# Patient Record
Sex: Male | Born: 1980 | Race: White | Hispanic: No | Smoking: Current every day smoker
Health system: Southern US, Community
[De-identification: ages and names within clinical notes are randomized; demographics above are authoritative.]

## PROBLEM LIST (undated history)

## (undated) DIAGNOSIS — F329 Major depressive disorder, single episode, unspecified: Secondary | ICD-10-CM

## (undated) DIAGNOSIS — Z8619 Personal history of other infectious and parasitic diseases: Secondary | ICD-10-CM

## (undated) DIAGNOSIS — C801 Malignant (primary) neoplasm, unspecified: Secondary | ICD-10-CM

## (undated) DIAGNOSIS — Z7189 Other specified counseling: Principal | ICD-10-CM

## (undated) DIAGNOSIS — K219 Gastro-esophageal reflux disease without esophagitis: Secondary | ICD-10-CM

## (undated) DIAGNOSIS — I82409 Acute embolism and thrombosis of unspecified deep veins of unspecified lower extremity: Secondary | ICD-10-CM

## (undated) DIAGNOSIS — R569 Unspecified convulsions: Secondary | ICD-10-CM

## (undated) DIAGNOSIS — F32A Depression, unspecified: Secondary | ICD-10-CM

## (undated) DIAGNOSIS — G43909 Migraine, unspecified, not intractable, without status migrainosus: Secondary | ICD-10-CM

## (undated) HISTORY — DX: Gastro-esophageal reflux disease without esophagitis: K21.9

## (undated) HISTORY — DX: Depression, unspecified: F32.A

## (undated) HISTORY — DX: Major depressive disorder, single episode, unspecified: F32.9

## (undated) HISTORY — DX: Migraine, unspecified, not intractable, without status migrainosus: G43.909

## (undated) HISTORY — DX: Unspecified convulsions: R56.9

## (undated) HISTORY — PX: CRANIOTOMY: SHX93

## (undated) HISTORY — DX: Personal history of other infectious and parasitic diseases: Z86.19

## (undated) HISTORY — DX: Acute embolism and thrombosis of unspecified deep veins of unspecified lower extremity: I82.409

## (undated) HISTORY — DX: Other specified counseling: Z71.89

## (undated) HISTORY — DX: Malignant (primary) neoplasm, unspecified: C80.1

---

## 2017-01-17 DIAGNOSIS — C719 Malignant neoplasm of brain, unspecified: Secondary | ICD-10-CM | POA: Insufficient documentation

## 2017-04-29 ENCOUNTER — Ambulatory Visit: Payer: BLUE CROSS/BLUE SHIELD

## 2017-04-29 ENCOUNTER — Other Ambulatory Visit (HOSPITAL_BASED_OUTPATIENT_CLINIC_OR_DEPARTMENT_OTHER): Payer: BLUE CROSS/BLUE SHIELD

## 2017-04-29 DIAGNOSIS — C719 Malignant neoplasm of brain, unspecified: Secondary | ICD-10-CM

## 2017-04-29 LAB — CBC WITH DIFFERENTIAL (CANCER CENTER ONLY)
BASO#: 0 10*3/uL (ref 0.0–0.2)
BASO%: 0.2 % (ref 0.0–2.0)
EOS%: 2.7 % (ref 0.0–7.0)
Eosinophils Absolute: 0.3 10*3/uL (ref 0.0–0.5)
HCT: 41.9 % (ref 38.7–49.9)
HGB: 14.3 g/dL (ref 13.0–17.1)
LYMPH#: 2.6 10*3/uL (ref 0.9–3.3)
LYMPH%: 28.6 % (ref 14.0–48.0)
MCH: 32.9 pg (ref 28.0–33.4)
MCHC: 34.1 g/dL (ref 32.0–35.9)
MCV: 96 fL (ref 82–98)
MONO#: 0.6 10*3/uL (ref 0.1–0.9)
MONO%: 6.7 % (ref 0.0–13.0)
NEUT%: 61.8 % (ref 40.0–80.0)
NEUTROS ABS: 5.6 10*3/uL (ref 1.5–6.5)
PLATELETS: 235 10*3/uL (ref 145–400)
RBC: 4.35 10*6/uL (ref 4.20–5.70)
RDW: 13 % (ref 11.1–15.7)
WBC: 9.1 10*3/uL (ref 4.0–10.0)

## 2017-04-29 LAB — CMP (CANCER CENTER ONLY)
ALT: 15 U/L (ref 10–47)
AST: 19 U/L (ref 11–38)
Albumin: 3.7 g/dL (ref 3.3–5.5)
Alkaline Phosphatase: 82 U/L (ref 26–84)
BILIRUBIN TOTAL: 1.2 mg/dL (ref 0.20–1.60)
BUN: 9 mg/dL (ref 7–22)
CO2: 34 meq/L — AB (ref 18–33)
Calcium: 9.7 mg/dL (ref 8.0–10.3)
Chloride: 104 mEq/L (ref 98–108)
Creat: 1 mg/dl (ref 0.6–1.2)
GLUCOSE: 97 mg/dL (ref 73–118)
Potassium: 3.3 mEq/L (ref 3.3–4.7)
SODIUM: 141 meq/L (ref 128–145)
Total Protein: 7.1 g/dL (ref 6.4–8.1)

## 2017-04-29 LAB — LACTATE DEHYDROGENASE: LDH: 125 U/L (ref 125–245)

## 2017-05-02 ENCOUNTER — Ambulatory Visit (HOSPITAL_BASED_OUTPATIENT_CLINIC_OR_DEPARTMENT_OTHER): Payer: BLUE CROSS/BLUE SHIELD | Admitting: Family

## 2017-05-02 VITALS — BP 103/76 | HR 90 | Temp 98.8°F | Resp 18 | Ht 74.0 in | Wt 169.0 lb

## 2017-05-02 DIAGNOSIS — M545 Low back pain: Secondary | ICD-10-CM | POA: Diagnosis not present

## 2017-05-02 DIAGNOSIS — C719 Malignant neoplasm of brain, unspecified: Secondary | ICD-10-CM

## 2017-05-02 DIAGNOSIS — R51 Headache: Secondary | ICD-10-CM

## 2017-05-02 DIAGNOSIS — C711 Malignant neoplasm of frontal lobe: Secondary | ICD-10-CM | POA: Diagnosis not present

## 2017-05-02 MED ORDER — MORPHINE SULFATE ER 15 MG PO TBCR: 15.0000 mg | EXTENDED_RELEASE_TABLET | Freq: Two times a day (BID) | ORAL | 0 refills | Status: DC

## 2017-05-02 NOTE — Progress Notes (Signed)
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 He was originally diagnosed in June 2015 while living in [place] and followed by Dr. Merrilee Seashore Avgeropoulos. He had a resection of the right frontal lobe at that time and pathology showed anaplastic astrocytoma grade 3. He then had adjuvant radiation therapy with concurrent Temodar completed in October 2015. He then went on to receive 6  More cycles of Temodar completed in May 2016.  February 2017 he had a right frontal cyst aspirated and was found to not be malignant. He had a Ommaya reservoir placed during that time.  In July 2017 he was found to have progression of disease and received 2 cycles of intrathecal cytarabine. 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 He then progressed again in January 2018 and was started treatment with BCNU at that time.  He had moved from [place] to [place] and from [place] now to [place] with his father as of April 2018. He is now followed by Dr. Loni Muse. Desjardins at [facility]. He has had 2 cycles of Lomustine and will get his 3rd on August 20th, same day as his [facility] follow-up with repeat lab work and scans.  His MRI of the brain earlier this month showed stable disease.  He is symptomatic with a consistent headache and issues with balance. He states that he has had several fall but thankfully has not seriously injured himself.  He has some nausea and occasional vomiting. He states that the vomiting is "random." His last 2 episodes have happened in the middle of the night.  He has a dry cough  and is still smoking 1/2 ppd. He had pneumonia 2 weeks ago treated with an antibiotic. He has had no other s/s of infection.  No fever, chills, SOB, chest pain, palpitations, abdominal pain or changes in bowel or bladder habits.  He has intermittent constipation and will try using a stool softener as needed.  He has a sandpaper rash on his back that itches at times. He states that this appears when he is on Decadron.  No swelling, tenderness or numbness in his extremities. He has occasional tingling in his hands that comes and goes.  Due to the multiple surgeries to his right frontal lobe he has mild weakness on the left side.   He is having lower back pain.  He does not have much of an appetite. He states that he is trying to stay well hydrated. His weight is 169 lbs at this time.   ROS: All other 10 point review of systems is negative.   PAST MEDICAL HISTORY:   ALLERGIES: Allergies not on file    MEDICATIONS:  No current outpatient prescriptions on file prior to visit.   No current facility-administered medications on file prior to visit.      PAST SURGICAL HISTORY No past surgical history on file.  FAMILY HISTORY: No family history on file.  SOCIAL HISTORY:  has no tobacco, alcohol, and drug history on file.  PERFORMANCE STATUS: The patient's performance status is 1 - Symptomatic but completely ambulatory  PHYSICAL EXAM: Most Recent Vital Signs: There were no vitals taken for this visit. There were no vitals taken for this visit.  General Appearance:    Alert, cooperative, no distress, appears stated age  Head:    Normocephalic, without obvious abnormality, atraumatic  Eyes:    PERRL, conjunctiva/corneas clear, EOM's intact, fundi    benign, both eyes             Throat:   Lips, mucosa, and tongue normal; teeth and gums normal  Neck:   Supple, symmetrical, trachea midline, no adenopathy;       thyroid:  No enlargement/tenderness/nodules; no carotid   bruit or JVD   Back:     Symmetric, no curvature, ROM normal, no CVA tenderness  Lungs:     Clear to auscultation bilaterally, respirations unlabored  Chest wall:    No tenderness or deformity  Heart:    Regular rate and rhythm, S1 and S2 normal, no murmur, rub   or gallop  Abdomen:     Soft, non-tender, bowel sounds active all four quadrants,    no masses, no organomegaly        Extremities:   Extremities normal, atraumatic, no cyanosis or edema  Pulses:   2+ and symmetric all extremities  Skin:   Skin color, texture, turgor normal, no rashes or lesions  Lymph nodes:   Cervical, supraclavicular, and axillary nodes normal  Neurologic:   CNII-XII intact. Mild weakness in left side, sensation and reflexes          LABORATORY DATA:  No results found for this or any previous visit (from the past 48 hour(s)).    RADIOGRAPHY: No results found.     PATHOLOGY: None  ASSESSMENT/PLAN: Zachary Farrell is a pleasant 35 yo caucasian gentleman with Glioblastoma multiforme or the right frontal lobe. He is currently on Lomustine 220 mg PO once every 6 weeks. He has had two cycles so far.  He goes back to [facility] on August 20th for labs, scans and follow-up with Dr. Loni Muse. Desjardins. He will also take his third cycle of Lomustine that same day.  He is symptomatic with lower back pain and constant headache despite taking his Oxycodone 10 mg as prescribed. He has taken MS Contin in the past and states that this helped resolve his pain. We will give him a prescription for MS contin 15 mg PO every 12 hours and then he has Oxycodone 10 mg PO as needed for breakthrough pain.  He will have weekly lab work for now.  We will plan to see him back in another 4 weeks (after he is seen at [facility]) for repeat lab work and follow-up.   All questions were answered and he is in agreement with the plan. Both the patient and his family know to contact our office with any questions or concerns. We can certainly see him much sooner if  necessary.  He was discussed with and also seen by Dr. Marin Olp and he is in agreement with the aforementioned.   [facility]     Addendum:  I saw and examined the patient with Katalaya Beel. I agree with the above assessment.  He really is getting all of his treatments at [facility]. He has done quite well. He currently is on lomustine.  He goes back to [facility] on August 20. He will have his scans done.  He does have a lot of headache. I think this probably is related to his having the surgeries. I think that he  probably needs some form of long-term pain control. He has been on MS Contin. We will see about getting him back onto MS Contin.  I'll see anything that we have to do for him otherwise.  I told he and his family that we can annoys see him at any time to help with any convocation that he may have.  We spent about an hour with him.  We will see him back after he has his appointment at [facility].  Lattie Haw, MD

## 2017-05-06 ENCOUNTER — Telehealth: Payer: Self-pay | Admitting: Behavioral Health

## 2017-05-06 NOTE — Telephone Encounter (Signed)
Unable to reach patient at time of Pre-Visit Call.  Left message for patient to return call when available.    

## 2017-05-09 ENCOUNTER — Encounter: Payer: Self-pay | Admitting: Family Medicine

## 2017-05-09 ENCOUNTER — Ambulatory Visit (INDEPENDENT_AMBULATORY_CARE_PROVIDER_SITE_OTHER): Payer: BLUE CROSS/BLUE SHIELD | Admitting: Family Medicine

## 2017-05-09 ENCOUNTER — Other Ambulatory Visit: Payer: Self-pay | Admitting: Family

## 2017-05-09 VITALS — BP 106/80 | HR 89 | Temp 98.5°F | Ht 74.0 in | Wt 168.8 lb

## 2017-05-09 DIAGNOSIS — G44229 Chronic tension-type headache, not intractable: Secondary | ICD-10-CM | POA: Diagnosis not present

## 2017-05-09 DIAGNOSIS — C719 Malignant neoplasm of brain, unspecified: Secondary | ICD-10-CM

## 2017-05-09 MED ORDER — RIZATRIPTAN BENZOATE 10 MG PO TABS: 10.0000 mg | ORAL_TABLET | ORAL | 0 refills | Status: DC | PRN

## 2017-05-09 NOTE — Patient Instructions (Signed)
Heat (pad or rice pillow in microwave) over affected area, 10-15 minutes every 2-3 hours while awake. This is to help with the headache that starts in the neck.  OK to take Tylenol 1000 mg (2 extra strength tabs) or 975 mg (3 regular strength tabs) every 6 hours as needed. Use this for the headache that starts in the neck. Same with the Maxalt (rizatriptan).   Try to use the oxycodone for the headache that is very sharp and makes you want to wrap your head up.  Fax me the letter at your earliest convenience.  Let us know if you need anything.  EXERCISES RANGE OF MOTION (ROM) AND STRETCHING EXERCISES  These exercises may help you when beginning to rehabilitate your issue. In order to successfully resolve your symptoms, you must improve your posture. These exercises are designed to help reduce the forward-head and rounded-shoulder posture which contributes to this condition. Your symptoms may resolve with or without further involvement from your physician, physical therapist or athletic trainer. While completing these exercises, remember:   Restoring tissue flexibility helps normal motion to return to the joints. This allows healthier, less painful movement and activity.  An effective stretch should be held for at least 20 seconds, although you may need to begin with shorter hold times for comfort.  A stretch should never be painful. You should only feel a gentle lengthening or release in the stretched tissue.  Do not do any stretch or exercise that you cannot tolerate.  STRETCH- Axial Extensors  Lie on your back on the floor. You may bend your knees for comfort. Place a rolled-up hand towel or dish towel, about 2 inches in diameter, under the part of your head that makes contact with the floor.  Gently tuck your chin, as if trying to make a "double chin," until you feel a gentle stretch at the base of your head.  Hold 15-20 seconds. Repeat 2-3 times. Complete this exercise 1 time per day.    STRETCH - Axial Extension   Stand or sit on a firm surface. Assume a good posture: chest up, shoulders drawn back, abdominal muscles slightly tense, knees unlocked (if standing) and feet hip width apart.  Slowly retract your chin so your head slides back and your chin slightly lowers. Continue to look straight ahead.  You should feel a gentle stretch in the back of your head. Be certain not to feel an aggressive stretch since this can cause headaches later.  Hold for 15-20 seconds. Repeat 2-3 times. Complete this exercise 1 time per day.  STRETCH - Cervical Side Bend   Stand or sit on a firm surface. Assume a good posture: chest up, shoulders drawn back, abdominal muscles slightly tense, knees unlocked (if standing) and feet hip width apart.  Without letting your nose or shoulders move, slowly tip your right / left ear to your shoulder until your feel a gentle stretch in the muscles on the opposite side of your neck.  Hold 15-20 seconds. Repeat 2-3 times. Complete this exercise 1-2 times per day.  STRETCH - Cervical Rotators   Stand or sit on a firm surface. Assume a good posture: chest up, shoulders drawn back, abdominal muscles slightly tense, knees unlocked (if standing) and feet hip width apart.  Keeping your eyes level with the ground, slowly turn your head until you feel a gentle stretch along the back and opposite side of your neck.  Hold 15-20 seconds. Repeat 2-3 times. Complete this exercise 1-2 times per  day.  RANGE OF MOTION - Neck Circles   Stand or sit on a firm surface. Assume a good posture: chest up, shoulders drawn back, abdominal muscles slightly tense, knees unlocked (if standing) and feet hip width apart.  Gently roll your head down and around from the back of one shoulder to the back of the other. The motion should never be forced or painful.  Repeat the motion 10-20 times, or until you feel the neck muscles relax and loosen. Repeat 2-3 times. Complete the  exercise 1-2 times per day. STRENGTHENING EXERCISES - Cervical Strain and Sprain These exercises may help you when beginning to rehabilitate your injury. They may resolve your symptoms with or without further involvement from your physician, physical therapist, or athletic trainer. While completing these exercises, remember:   Muscles can gain both the endurance and the strength needed for everyday activities through controlled exercises.  Complete these exercises as instructed by your physician, physical therapist, or athletic trainer. Progress the resistance and repetitions only as guided.  You may experience muscle soreness or fatigue, but the pain or discomfort you are trying to eliminate should never worsen during these exercises. If this pain does worsen, stop and make certain you are following the directions exactly. If the pain is still present after adjustments, discontinue the exercise until you can discuss the trouble with your clinician.  STRENGTH - Cervical Flexors, Isometric  Face a wall, standing about 6 inches away. Place a small pillow, a ball about 6-8 inches in diameter, or a folded towel between your forehead and the wall.  Slightly tuck your chin and gently push your forehead into the soft object. Push only with mild to moderate intensity, building up tension gradually. Keep your jaw and forehead relaxed.  Hold 10 to 20 seconds. Keep your breathing relaxed.  Release the tension slowly. Relax your neck muscles completely before you start the next repetition. Repeat 2-3 times. Complete this exercise 1 time per day.  STRENGTH- Cervical Lateral Flexors, Isometric   Stand about 6 inches away from a wall. Place a small pillow, a ball about 6-8 inches in diameter, or a folded towel between the side of your head and the wall.  Slightly tuck your chin and gently tilt your head into the soft object. Push only with mild to moderate intensity, building up tension gradually. Keep  your jaw and forehead relaxed.  Hold 10 to 20 seconds. Keep your breathing relaxed.  Release the tension slowly. Relax your neck muscles completely before you start the next repetition. Repeat 2-3 times. Complete this exercise 1 time per day.  STRENGTH - Cervical Extensors, Isometric   Stand about 6 inches away from a wall. Place a small pillow, a ball about 6-8 inches in diameter, or a folded towel between the back of your head and the wall.  Slightly tuck your chin and gently tilt your head back into the soft object. Push only with mild to moderate intensity, building up tension gradually. Keep your jaw and forehead relaxed.  Hold 10 to 20 seconds. Keep your breathing relaxed.  Release the tension slowly. Relax your neck muscles completely before you start the next repetition. Repeat 2-3 times. Complete this exercise 1 time per day.  POSTURE AND BODY MECHANICS CONSIDERATIONS Keeping correct posture when sitting, standing or completing your activities will reduce the stress put on different body tissues, allowing injured tissues a chance to heal and limiting painful experiences. The following are general guidelines for improved posture. Your physician or  physical therapist will provide you with any instructions specific to your needs. While reading these guidelines, remember:  The exercises prescribed by your provider will help you have the flexibility and strength to maintain correct postures.  The correct posture provides the optimal environment for your joints to work. All of your joints have less wear and tear when properly supported by a spine with good posture. This means you will experience a healthier, less painful body.  Correct posture must be practiced with all of your activities, especially prolonged sitting and standing. Correct posture is as important when doing repetitive low-stress activities (typing) as it is when doing a single heavy-load activity (lifting).  PROLONGED  STANDING WHILE SLIGHTLY LEANING FORWARD When completing a task that requires you to lean forward while standing in one place for a long time, place either foot up on a stationary 2- to 4-inch high object to help maintain the best posture. When both feet are on the ground, the low back tends to lose its slight inward curve. If this curve flattens (or becomes too large), then the back and your other joints will experience too much stress, fatigue more quickly, and can cause pain.   RESTING POSITIONS Consider which positions are most painful for you when choosing a resting position. If you have pain with flexion-based activities (sitting, bending, stooping, squatting), choose a position that allows you to rest in a less flexed posture. You would want to avoid curling into a fetal position on your side. If your pain worsens with extension-based activities (prolonged standing, working overhead), avoid resting in an extended position such as sleeping on your stomach. Most people will find more comfort when they rest with their spine in a more neutral position, neither too rounded nor too arched. Lying on a non-sagging bed on your side with a pillow between your knees, or on your back with a pillow under your knees will often provide some relief. Keep in mind, being in any one position for a prolonged period of time, no matter how correct your posture, can still lead to stiffness.  WALKING Walk with an upright posture. Your ears, shoulders, and hips should all line up. OFFICE WORK When working at a desk, create an environment that supports good, upright posture. Without extra support, muscles fatigue and lead to excessive strain on joints and other tissues.  CHAIR:  A chair should be able to slide under your desk when your back makes contact with the back of the chair. This allows you to work closely.  The chair's height should allow your eyes to be level with the upper part of your monitor and your hands to  be slightly lower than your elbows.  Body position: ? Your feet should make contact with the floor. If this is not possible, use a foot rest. ? Keep your ears over your shoulders. This will reduce stress on your neck and low back.

## 2017-05-09 NOTE — Progress Notes (Signed)
Chief Complaint  Patient presents with  . Establish Care    Establish for Primary Care       New Patient Visit SUBJECTIVE: HPI: Zachary Farrell is an 36 y.o.male who is being seen for establishing care.  The patient was previously seen at office in [place], [place]. He is here with his father who helps with the hx.  Patient has a history of stage IV glioma blastoma. He is undergoing a clinical trial at [facility]. His biggest concern today is that he has frequent headaches. He describes 2 different types of headaches. One starts in his neck and will radiate around both sides of his head. The other headache is behind his eyes and extremely sharp. He seems the latter is related to his glioblastoma. He's been using oxycodone for breakthrough pain. He will use at approximately 3 times per day. It is gotten to the point where he is unable to get a refill before he runs out. He also uses extended release morphine. He is currently at 15 mg twice daily. His father is asking if I will rx him Stadol.  No Known Allergies  Past Medical History:  Diagnosis Date  . Cancer (HCC)    Glioblastoma Grade 4  . Depression   . GERD (gastroesophageal reflux disease)   . History of chicken pox   . Migraine   . Seizures ([place])    Past Surgical History:  Procedure Laterality Date  . CRANIOTOMY     Times 3   Social History   Social History  . Marital status: Legally Separated   Social History Main Topics  . Smoking status: Current Every Day Smoker    Packs/day: 0.50    Years: 20.00    Types: Cigarettes  . Smokeless tobacco: Never Used  . Alcohol use No  . Drug use: No   Family History  Problem Relation Age of Onset  . Heart attack Neg Hx      Current Outpatient Prescriptions:  .  ALPRAZolam (XANAX) 1 MG tablet, Take 1 mg by mouth 4 (four) times daily., Disp: , Rfl:  .  amitriptyline (ELAVIL) 50 MG tablet, Take by mouth., Disp: , Rfl:  .  dexamethasone (DECADRON) 2 MG tablet, Take 2 mg  by mouth daily., Disp: , Rfl:  .  dronabinol (MARINOL) 5 MG capsule, Take 10 mg by mouth 2 (two) times daily., Disp: , Rfl:  .  famotidine (PEPCID) 20 MG tablet, Take by mouth 2 (two) times daily. , Disp: , Rfl:  .  lamoTRIgine (LAMICTAL) 100 MG tablet, Start taking lamotrigine 50 mg every morning for 2 weeks, increase to 50 mg twice daily for weeks, at week 5 increase to 100 mg twice daily., Disp: , Rfl:  .  lomustine (CEENU) 100 MG capsule, Take 2 capsules (with 2 tab of 10 mg. Total dose 220mg ) by mouth at night on day 1 of each cycle (one dose per 6 week cycle)., Disp: , Rfl:  .  methocarbamol (ROBAXIN) 500 MG tablet, Take by mouth 3 (three) times daily. , Disp: , Rfl:  .  morphine (MS CONTIN) 15 MG 12 hr tablet, Take 1 tablet (15 mg total) by mouth every 12 (twelve) hours., Disp: 60 tablet, Rfl: 0 .  ondansetron (ZOFRAN) 8 MG tablet, Take 1 tablet (8 mg) by mouth 1 hour before lomustine on day 1 and every 8 hours as needed for nausea., Disp: , Rfl:  .  Oxycodone HCl 10 MG TABS, Take by mouth 4 (four)  times daily. , Disp: , Rfl:  .  zolpidem (AMBIEN) 10 MG tablet, Take 10 mg by mouth at bedtime. , Disp: , Rfl:  .  lomustine (CEENU) 10 MG capsule, Take 2 capsules (with 2 tabs of 100mg . Total dose 220mg ) by mouth at night on day 1 of each cycle (one dose per 6 week cycle)., Disp: , Rfl:  .  rizatriptan (MAXALT) 10 MG tablet, Take 1 tablet (10 mg total) by mouth as needed for migraine. May repeat in 2 hours if needed, Disp: 10 tablet, Rfl: 0  ROS Neuro: +headaches  Eyes: No vision changes   OBJECTIVE: BP 106/80 (BP Location: Left Arm, Patient Position: Sitting, Cuff Size: Normal)   Pulse 89   Temp 98.5 F (36.9 C) (Oral)   Ht 6\' 2"  (1.88 m)   Wt 168 lb 12.8 oz (76.6 kg)   SpO2 97%   BMI 21.67 kg/m   Constitutional: -  VS reviewed -  Well developed, well nourished, appears stated age -  No apparent distress  Psychiatric: -  Flat affect -  Fluent conversation  Eye: -  Conjunctivae  clear, no discharge -  Pupils symmetric, round, reactive to light  ENMT: -  Oral mucosa without lesions, tongue and uvula midline    Tonsils not enlarged, no erythema, no exudate, trachea midline    Pharynx moist, no lesions, no erythema  Neck: -  No gross swelling, no palpable masses -  Thyroid midline, not enlarged, mobile, no palpable masses  Cardiovascular: -  RRR, no murmurs -  No LE edema  Respiratory: -  Normal respiratory effort, no accessory muscle use, no retraction -  Breath sounds equal, no wheezes, no ronchi, no crackles  Gastrointestinal: -  Bowel sounds normal -  No tenderness, no distention, no guarding, no masses  Neurological:  -  CN II - XII grossly intact -  Sensation grossly intact to light touch, equal bilaterally  Musculoskeletal: -  No clubbing, no cyanosis -  Gait normal -  4/5 strength with elbow flexion/extension, hip flexion, knee flexion/extension, all on L side -  5/5 strength throughout otherwise  Skin: -  No significant lesion on inspection -  Warm and dry to palpation   ASSESSMENT/PLAN: GBM (glioblastoma multiforme) (HCC)  Chronic tension-type headache, not intractable - Plan: rizatriptan (MAXALT) 10 MG tablet  It does not sound like I can reorder more narcotics, which I would consider given his dx. I would like him to reserve the oxycodone for the sharp headaches that he associates with his GBM. For the headache starts in the neck, I would like him to consider Tylenol, Maxalt, and heat. Home stretches and exercises given to help prevent these types of headaches as well. The sound like tension headaches. He also needs a letter for his parole officer regarding his dx (specifically asked for the dx in the letter) and prognosis. Dad will fax in latter. Patient should return as needed. The patient and his father voiced understanding and agreement to the plan.   Post, DO 05/09/17  4:47 PM

## 2017-05-16 ENCOUNTER — Other Ambulatory Visit: Payer: Self-pay | Admitting: *Deleted

## 2017-05-16 ENCOUNTER — Other Ambulatory Visit: Payer: Self-pay | Admitting: Hematology & Oncology

## 2017-05-16 ENCOUNTER — Other Ambulatory Visit (HOSPITAL_BASED_OUTPATIENT_CLINIC_OR_DEPARTMENT_OTHER): Payer: Medicaid Other

## 2017-05-16 DIAGNOSIS — C719 Malignant neoplasm of brain, unspecified: Secondary | ICD-10-CM

## 2017-05-16 DIAGNOSIS — C711 Malignant neoplasm of frontal lobe: Secondary | ICD-10-CM

## 2017-05-16 LAB — CBC WITH DIFFERENTIAL (CANCER CENTER ONLY)
BASO#: 0 10*3/uL (ref 0.0–0.2)
BASO%: 0.2 % (ref 0.0–2.0)
EOS%: 2.3 % (ref 0.0–7.0)
Eosinophils Absolute: 0.1 10*3/uL (ref 0.0–0.5)
HCT: 41.1 % (ref 38.7–49.9)
HEMOGLOBIN: 14.4 g/dL (ref 13.0–17.1)
LYMPH#: 1 10*3/uL (ref 0.9–3.3)
LYMPH%: 16.5 % (ref 14.0–48.0)
MCH: 32.9 pg (ref 28.0–33.4)
MCHC: 35 g/dL (ref 32.0–35.9)
MCV: 94 fL (ref 82–98)
MONO#: 0.3 10*3/uL (ref 0.1–0.9)
MONO%: 4.9 % (ref 0.0–13.0)
NEUT%: 76.1 % (ref 40.0–80.0)
NEUTROS ABS: 4.4 10*3/uL (ref 1.5–6.5)
RBC: 4.38 10*6/uL (ref 4.20–5.70)
RDW: 12.7 % (ref 11.1–15.7)
WBC: 5.8 10*3/uL (ref 4.0–10.0)

## 2017-05-16 LAB — CMP (CANCER CENTER ONLY)
ALK PHOS: 73 U/L (ref 26–84)
ALT: 26 U/L (ref 10–47)
AST: 31 U/L (ref 11–38)
Albumin: 4 g/dL (ref 3.3–5.5)
BILIRUBIN TOTAL: 1.6 mg/dL (ref 0.20–1.60)
BUN: 10 mg/dL (ref 7–22)
CO2: 30 mEq/L (ref 18–33)
CREATININE: 1.3 mg/dL — AB (ref 0.6–1.2)
Calcium: 9.4 mg/dL (ref 8.0–10.3)
Chloride: 102 mEq/L (ref 98–108)
Glucose, Bld: 112 mg/dL (ref 73–118)
Potassium: 4 mEq/L (ref 3.3–4.7)
SODIUM: 137 meq/L (ref 128–145)
TOTAL PROTEIN: 7.3 g/dL (ref 6.4–8.1)

## 2017-05-16 MED ORDER — OXYCODONE HCL 15 MG PO TABS: 15.0000 mg | ORAL_TABLET | Freq: Four times a day (QID) | ORAL | 0 refills | Status: DC | PRN

## 2017-05-23 ENCOUNTER — Other Ambulatory Visit: Payer: Self-pay | Admitting: *Deleted

## 2017-05-23 ENCOUNTER — Other Ambulatory Visit (HOSPITAL_BASED_OUTPATIENT_CLINIC_OR_DEPARTMENT_OTHER): Payer: Medicaid Other

## 2017-05-23 DIAGNOSIS — C711 Malignant neoplasm of frontal lobe: Secondary | ICD-10-CM | POA: Diagnosis present

## 2017-05-23 DIAGNOSIS — C719 Malignant neoplasm of brain, unspecified: Secondary | ICD-10-CM

## 2017-05-23 LAB — CBC WITH DIFFERENTIAL (CANCER CENTER ONLY)
BASO#: 0 10*3/uL (ref 0.0–0.2)
BASO%: 0.3 % (ref 0.0–2.0)
EOS ABS: 0.1 10*3/uL (ref 0.0–0.5)
EOS%: 2.5 % (ref 0.0–7.0)
HCT: 38 % — ABNORMAL LOW (ref 38.7–49.9)
HEMOGLOBIN: 13.1 g/dL (ref 13.0–17.1)
LYMPH#: 1.1 10*3/uL (ref 0.9–3.3)
LYMPH%: 30.3 % (ref 14.0–48.0)
MCH: 32.8 pg (ref 28.0–33.4)
MCHC: 34.5 g/dL (ref 32.0–35.9)
MCV: 95 fL (ref 82–98)
MONO#: 0.3 10*3/uL (ref 0.1–0.9)
MONO%: 7.4 % (ref 0.0–13.0)
NEUT%: 59.5 % (ref 40.0–80.0)
NEUTROS ABS: 2.1 10*3/uL (ref 1.5–6.5)
Platelets: 84 10*3/uL — ABNORMAL LOW (ref 145–400)
RBC: 4 10*6/uL — AB (ref 4.20–5.70)
RDW: 12.7 % (ref 11.1–15.7)
WBC: 3.5 10*3/uL — AB (ref 4.0–10.0)

## 2017-05-23 LAB — CMP (CANCER CENTER ONLY)
ALT(SGPT): 22 U/L (ref 10–47)
AST: 24 U/L (ref 11–38)
Albumin: 3.7 g/dL (ref 3.3–5.5)
Alkaline Phosphatase: 89 U/L — ABNORMAL HIGH (ref 26–84)
BUN: 14 mg/dL (ref 7–22)
CHLORIDE: 101 meq/L (ref 98–108)
CO2: 30 meq/L (ref 18–33)
CREATININE: 1.2 mg/dL (ref 0.6–1.2)
Calcium: 9.3 mg/dL (ref 8.0–10.3)
Glucose, Bld: 116 mg/dL (ref 73–118)
POTASSIUM: 3.9 meq/L (ref 3.3–4.7)
SODIUM: 141 meq/L (ref 128–145)
Total Bilirubin: 1.1 mg/dl (ref 0.20–1.60)
Total Protein: 7.2 g/dL (ref 6.4–8.1)

## 2017-05-23 MED ORDER — DRONABINOL 5 MG PO CAPS: 10.0000 mg | ORAL_CAPSULE | Freq: Two times a day (BID) | ORAL | 2 refills | Status: DC

## 2017-05-24 ENCOUNTER — Other Ambulatory Visit: Payer: Self-pay | Admitting: *Deleted

## 2017-05-30 ENCOUNTER — Other Ambulatory Visit: Payer: BLUE CROSS/BLUE SHIELD

## 2017-05-31 ENCOUNTER — Other Ambulatory Visit: Payer: Self-pay | Admitting: Hematology & Oncology

## 2017-05-31 DIAGNOSIS — C719 Malignant neoplasm of brain, unspecified: Secondary | ICD-10-CM

## 2017-06-01 ENCOUNTER — Other Ambulatory Visit: Payer: Self-pay | Admitting: *Deleted

## 2017-06-01 DIAGNOSIS — C719 Malignant neoplasm of brain, unspecified: Secondary | ICD-10-CM

## 2017-06-01 MED ORDER — MORPHINE SULFATE ER 15 MG PO TBCR: 15.0000 mg | EXTENDED_RELEASE_TABLET | Freq: Two times a day (BID) | ORAL | 0 refills | Status: DC

## 2017-06-02 ENCOUNTER — Telehealth: Payer: Self-pay | Admitting: Family Medicine

## 2017-06-02 DIAGNOSIS — C719 Malignant neoplasm of brain, unspecified: Secondary | ICD-10-CM

## 2017-06-03 ENCOUNTER — Ambulatory Visit (HOSPITAL_COMMUNITY)
Admission: RE | Admit: 2017-06-03 | Discharge: 2017-06-03 | Disposition: A | Discharge status: Home / Self Care | Payer: Medicaid Other | Source: Ambulatory Visit | Attending: Hematology & Oncology | Admitting: Hematology & Oncology

## 2017-06-03 DIAGNOSIS — C719 Malignant neoplasm of brain, unspecified: Secondary | ICD-10-CM | POA: Diagnosis present

## 2017-06-03 DIAGNOSIS — Z9889 Other specified postprocedural states: Secondary | ICD-10-CM | POA: Insufficient documentation

## 2017-06-03 MED ORDER — ZOLPIDEM TARTRATE 10 MG PO TABS: 10.0000 mg | ORAL_TABLET | Freq: Every day | ORAL | 1 refills | Status: DC

## 2017-06-03 MED ORDER — GADOBENATE DIMEGLUMINE 529 MG/ML IV SOLN
15.0000 mL | Freq: Once | INTRAVENOUS | Status: AC | PRN
  Administered 2017-06-03: 15 mL via INTRAVENOUS

## 2017-06-03 NOTE — Telephone Encounter (Signed)
Faxed refill to [facility]/patient notified done.

## 2017-06-03 NOTE — Telephone Encounter (Signed)
Last appt 05/09/2017 Do not see last refill on zolpidem

## 2017-06-03 NOTE — Telephone Encounter (Signed)
OK to call in 30 w 1 refill. TY.

## 2017-06-03 NOTE — Telephone Encounter (Signed)
°  Relation to EI:HDTPNS / Cristal Ford  Call back number:[phone]  Reason for call:  Father requesting a refill zolpidem (AMBIEN) 10 MG tablet

## 2017-06-06 ENCOUNTER — Other Ambulatory Visit (HOSPITAL_BASED_OUTPATIENT_CLINIC_OR_DEPARTMENT_OTHER): Payer: Medicaid Other

## 2017-06-06 ENCOUNTER — Ambulatory Visit (HOSPITAL_BASED_OUTPATIENT_CLINIC_OR_DEPARTMENT_OTHER): Payer: Medicaid Other | Admitting: Hematology & Oncology

## 2017-06-06 DIAGNOSIS — R51 Headache: Secondary | ICD-10-CM | POA: Diagnosis not present

## 2017-06-06 DIAGNOSIS — C719 Malignant neoplasm of brain, unspecified: Secondary | ICD-10-CM

## 2017-06-06 DIAGNOSIS — C711 Malignant neoplasm of frontal lobe: Secondary | ICD-10-CM

## 2017-06-06 DIAGNOSIS — G8929 Other chronic pain: Secondary | ICD-10-CM | POA: Diagnosis not present

## 2017-06-06 LAB — CBC WITH DIFFERENTIAL (CANCER CENTER ONLY)
BASO#: 0 10*3/uL (ref 0.0–0.2)
BASO%: 0.4 % (ref 0.0–2.0)
EOS ABS: 0.1 10*3/uL (ref 0.0–0.5)
EOS%: 1.1 % (ref 0.0–7.0)
HEMATOCRIT: 41.3 % (ref 38.7–49.9)
HEMOGLOBIN: 14.3 g/dL (ref 13.0–17.1)
LYMPH#: 1 10*3/uL (ref 0.9–3.3)
LYMPH%: 19.5 % (ref 14.0–48.0)
MCH: 33.4 pg (ref 28.0–33.4)
MCHC: 34.6 g/dL (ref 32.0–35.9)
MCV: 97 fL (ref 82–98)
MONO#: 0.6 10*3/uL (ref 0.1–0.9)
MONO%: 10.5 % (ref 0.0–13.0)
NEUT%: 68.5 % (ref 40.0–80.0)
NEUTROS ABS: 3.6 10*3/uL (ref 1.5–6.5)
Platelets: 282 10*3/uL (ref 145–400)
RBC: 4.28 10*6/uL (ref 4.20–5.70)
RDW: 13.9 % (ref 11.1–15.7)
WBC: 5.2 10*3/uL (ref 4.0–10.0)

## 2017-06-06 LAB — CMP (CANCER CENTER ONLY)
ALBUMIN: 3.8 g/dL (ref 3.3–5.5)
ALT(SGPT): 13 U/L (ref 10–47)
AST: 25 U/L (ref 11–38)
Alkaline Phosphatase: 98 U/L — ABNORMAL HIGH (ref 26–84)
BUN, Bld: 6 mg/dL — ABNORMAL LOW (ref 7–22)
CALCIUM: 9.6 mg/dL (ref 8.0–10.3)
CO2: 29 meq/L (ref 18–33)
Chloride: 104 mEq/L (ref 98–108)
Creat: 1.3 mg/dl — ABNORMAL HIGH (ref 0.6–1.2)
Glucose, Bld: 93 mg/dL (ref 73–118)
POTASSIUM: 3.7 meq/L (ref 3.3–4.7)
Sodium: 142 mEq/L (ref 128–145)
Total Bilirubin: 0.8 mg/dl (ref 0.20–1.60)
Total Protein: 7.3 g/dL (ref 6.4–8.1)

## 2017-06-06 MED ORDER — ALPRAZOLAM 1 MG PO TABS: 1.0000 mg | ORAL_TABLET | Freq: Four times a day (QID) | ORAL | 0 refills | Status: DC

## 2017-06-06 MED ORDER — LOMUSTINE 100 MG PO CAPS: ORAL_CAPSULE | ORAL | 2 refills | Status: DC

## 2017-06-06 MED ORDER — MEGESTROL ACETATE 625 MG/5ML PO SUSP: 625.0000 mg | Freq: Every day | ORAL | 3 refills | Status: DC

## 2017-06-06 MED ORDER — LOMUSTINE 10 MG PO CAPS: ORAL_CAPSULE | ORAL | 2 refills | Status: DC

## 2017-06-07 NOTE — Progress Notes (Signed)
Hematology and Oncology Follow Up Visit  Zachary Farrell [id number] 02/20/1981 36 y.o. 06/07/2017   Principle Diagnosis:   Recurrent glioblastoma multiforme of the right frontal lobe  Current Therapy:    Lomustine 220 mg po q 6 week     Interim History:  Zachary Farrell is back for follow-up. This is his second office visit. We saw him back in July.  He is really followed at [facility] for his glioblastoma.  He is on lomustine right now. He seems to have tolerated this pretty well. He does get somewhat thrombocytopenic from it.  We did go ahead and get a MRI of his brain. This was done on August 24. This base showed stable disease. He had resection of the right frontal tumor with residual enhancement that was similar. There is extra-axial fluid over the right hemispheric back communicates with the right lateral ventricle. This was noted before.  He does have some headaches. He apparently had some seizures. He did not go to the emergency room. He is on Lamictal for this.  He does have chronic pain. Has chronic headaches. His MS Contin and oxycodone aren't helping.  His appetite is not good. We tried him on some Marinol. This really did not work. I will try him on some Megace ES elixir. Maybe this will help improve his appetite a little bit.  Overall, his performance status is ECOG 1.  Medications:  Current Outpatient Prescriptions:  .  ALPRAZolam (XANAX) 1 MG tablet, Take 1 tablet (1 mg total) by mouth 4 (four) times daily., Disp: 120 tablet, Rfl: 0 .  amitriptyline (ELAVIL) 50 MG tablet, Take by mouth., Disp: , Rfl:  .  dexamethasone (DECADRON) 2 MG tablet, Take 2 mg by mouth daily., Disp: , Rfl:  .  famotidine (PEPCID) 20 MG tablet, Take by mouth 2 (two) times daily. , Disp: , Rfl:  .  lamoTRIgine (LAMICTAL) 100 MG tablet, Start taking lamotrigine 50 mg every morning for 2 weeks, increase to 50 mg twice daily for weeks, at week 5 increase to 100 mg twice daily., Disp: , Rfl:  .  lomustine  (CEENU) 10 MG capsule, Take 2 capsules (with 2 tabs of 100mg . Total dose 220mg ) by mouth at night on day 1 of each cycle (one dose per 6 week cycle)., Disp: 2 capsule, Rfl: 2 .  lomustine (CEENU) 100 MG capsule, Take 2 capsules (with 2 tab of 10 mg. Total dose 220mg ) by mouth at night on day 1 of each cycle (one dose per 6 week cycle)., Disp: 2 capsule, Rfl: 2 .  megestrol (MEGACE ES) 625 MG/5ML suspension, Take 5 mLs (625 mg total) by mouth daily., Disp: 150 mL, Rfl: 3 .  methocarbamol (ROBAXIN) 500 MG tablet, Take by mouth 3 (three) times daily. , Disp: , Rfl:  .  morphine (MS CONTIN) 15 MG 12 hr tablet, Take 1 tablet (15 mg total) by mouth every 12 (twelve) hours., Disp: 60 tablet, Rfl: 0 .  ondansetron (ZOFRAN) 8 MG tablet, Take 1 tablet (8 mg) by mouth 1 hour before lomustine on day 1 and every 8 hours as needed for nausea., Disp: , Rfl:  .  oxyCODONE (ROXICODONE) 15 MG immediate release tablet, Take 1 tablet (15 mg total) by mouth every 6 (six) hours as needed for severe pain., Disp: 120 tablet, Rfl: 0 .  rizatriptan (MAXALT) 10 MG tablet, Take 1 tablet (10 mg total) by mouth as needed for migraine. May repeat in 2 hours if needed, Disp: 10 tablet,  Rfl: 0 .  zolpidem (AMBIEN) 10 MG tablet, Take 1 tablet (10 mg total) by mouth at bedtime., Disp: 30 tablet, Rfl: 1  Allergies: No Known Allergies  Past Medical History, Surgical history, Social history, and Family History were reviewed and updated.  Review of Systems: Review of Systems  Constitutional: Negative for appetite change, fatigue, fever and unexpected weight change.  HENT:   Negative for lump/mass, mouth sores, sore throat and trouble swallowing.   Respiratory: Negative for cough, hemoptysis and shortness of breath.   Cardiovascular: Negative for leg swelling and palpitations.  Gastrointestinal: Negative for abdominal distention, abdominal pain, blood in stool, constipation, diarrhea, nausea and vomiting.  Genitourinary: Negative for  bladder incontinence, dysuria, frequency and hematuria.   Musculoskeletal: Negative for arthralgias, back pain, gait problem and myalgias.  Skin: Negative for itching and rash.  Neurological: Negative for dizziness, extremity weakness, gait problem, headaches, numbness, seizures and speech difficulty.  Hematological: Does not bruise/bleed easily.  Psychiatric/Behavioral: Negative for depression and sleep disturbance. The patient is not nervous/anxious.     Physical Exam:  weight is 163 lb (73.9 kg). His oral temperature is 98.7 F (37.1 C). His blood pressure is 104/74 and his pulse is 116 (abnormal). His respiration is 20 and oxygen saturation is 100%.   Wt Readings from Last 3 Encounters:  06/06/17 163 lb (73.9 kg)  05/09/17 168 lb 12.8 oz (76.6 kg)  05/02/17 169 lb (76.7 kg)    Physical Exam  Constitutional: He is oriented to person, place, and time.  HENT:  Head: Normocephalic and atraumatic.  Mouth/Throat: Oropharynx is clear and moist.  He does have the healed craniotomy scar in the right scalp. This is well-healed.  Eyes: Pupils are equal, round, and reactive to light. EOM are normal.  Neck: Normal range of motion.  Cardiovascular: Normal rate, regular rhythm and normal heart sounds.   Pulmonary/Chest: Effort normal and breath sounds normal.  Abdominal: Soft. Bowel sounds are normal.  Musculoskeletal: Normal range of motion. He exhibits no edema, tenderness or deformity.  Lymphadenopathy:    He has no cervical adenopathy.  Neurological: He is alert and oriented to person, place, and time.  Skin: Skin is warm and dry. No rash noted. No erythema.  Psychiatric: He has a normal mood and affect. His behavior is normal. Judgment and thought content normal.  Vitals reviewed.    Lab Results  Component Value Date   WBC 5.2 06/06/2017   HGB 14.3 06/06/2017   HCT 41.3 06/06/2017   MCV 97 06/06/2017   PLT 282 06/06/2017     Chemistry      Component Value Date/Time   NA  142 06/06/2017 1431   K 3.7 06/06/2017 1431   CL 104 06/06/2017 1431   CO2 29 06/06/2017 1431   BUN 6 (L) 06/06/2017 1431   CREATININE 1.3 (H) 06/06/2017 1431      Component Value Date/Time   CALCIUM 9.6 06/06/2017 1431   ALKPHOS 98 (H) 06/06/2017 1431   AST 25 06/06/2017 1431   ALT 13 06/06/2017 1431   BILITOT 0.80 06/06/2017 1431         Impression and Plan: Mr. Siler is a 36 year old white male with a recurrent glioblastoma. Again, he is followed at [facility] for the most part. He will start his next cycle of lomustine. He sees his neuro oncologist at [facility] in late September orally October.  I am somewhat encouraged by the recent MRI. It does not look like anything really is progressing.  We  will refill some of his medications. We cannot refill his pain medication for another week or so. He understands this.  I will plan to see him back myself in another month.  He knows that he can was come to see [place] or have lab work done if necessary.   Volanda Napoleon, MD 8/28/20187:24 AM

## 2017-06-09 ENCOUNTER — Other Ambulatory Visit: Payer: Self-pay | Admitting: *Deleted

## 2017-06-09 DIAGNOSIS — C719 Malignant neoplasm of brain, unspecified: Secondary | ICD-10-CM

## 2017-06-09 MED ORDER — MEGESTROL ACETATE 400 MG/10ML PO SUSP: 800.0000 mg | Freq: Every day | ORAL | 6 refills | Status: DC

## 2017-06-10 ENCOUNTER — Telehealth: Payer: Self-pay | Admitting: Hematology & Oncology

## 2017-06-10 NOTE — Telephone Encounter (Signed)
Oral Oncology Patient Advocate Encounter  Received an e-mail from Baxter Flattery at Dr. Antonieta Pert office wanting to know what was going on with patients Lomustine. I called [facility] and spoke with Cecille Rubin, she said they had been trying to get in touch with the patient and had not received a call back. I gave her the number we had. She said the  patient needed to call to answer new questions before they would fill his Lomustine.   I called and ask to speak to Zachary Farrell and his dad [name]) said he handled his medicaine and it was on [place] to call [facility] they needed information from [place]. I explained I had just spoke with them and [name] needed to call and answer their questions.  I told him I would call [facility] back and double check they had what they need from [place].   [place] and they said the patient had answered the questions in January but this was a new medication and he would need to call them. I asked Simona Huh would she please call the patient. She was going to call him and try to speed up the process. I told her I just wanted the patient to get his medication.   [place] Patient Advocate [phone] 06/10/2017 8:36 AM

## 2017-06-14 ENCOUNTER — Other Ambulatory Visit: Payer: Self-pay | Admitting: *Deleted

## 2017-06-14 DIAGNOSIS — C719 Malignant neoplasm of brain, unspecified: Secondary | ICD-10-CM

## 2017-06-14 MED ORDER — OXYCODONE HCL 15 MG PO TABS: 15.0000 mg | ORAL_TABLET | Freq: Four times a day (QID) | ORAL | 0 refills | Status: DC | PRN

## 2017-06-14 NOTE — Telephone Encounter (Signed)
Oral Oncology Patient Advocate Encounter  Per [facility] Lomustine was shipped 06/10/2017.  [place] Patient Advocate [phone] 06/14/2017 1:09 PM

## 2017-06-15 ENCOUNTER — Other Ambulatory Visit (HOSPITAL_BASED_OUTPATIENT_CLINIC_OR_DEPARTMENT_OTHER): Payer: Medicaid Other

## 2017-06-15 DIAGNOSIS — C711 Malignant neoplasm of frontal lobe: Secondary | ICD-10-CM | POA: Diagnosis present

## 2017-06-15 DIAGNOSIS — C719 Malignant neoplasm of brain, unspecified: Secondary | ICD-10-CM

## 2017-06-15 LAB — CMP (CANCER CENTER ONLY)
ALBUMIN: 3.8 g/dL (ref 3.3–5.5)
ALK PHOS: 77 U/L (ref 26–84)
ALT: 20 U/L (ref 10–47)
AST: 24 U/L (ref 11–38)
BUN, Bld: 14 mg/dL (ref 7–22)
CALCIUM: 9.8 mg/dL (ref 8.0–10.3)
CHLORIDE: 101 meq/L (ref 98–108)
CO2: 33 mEq/L (ref 18–33)
Creat: 1.1 mg/dl (ref 0.6–1.2)
Glucose, Bld: 126 mg/dL — ABNORMAL HIGH (ref 73–118)
POTASSIUM: 4.4 meq/L (ref 3.3–4.7)
Sodium: 140 mEq/L (ref 128–145)
TOTAL PROTEIN: 7 g/dL (ref 6.4–8.1)
Total Bilirubin: 1.3 mg/dl (ref 0.20–1.60)

## 2017-06-15 LAB — CBC WITH DIFFERENTIAL (CANCER CENTER ONLY)
BASO#: 0 10*3/uL (ref 0.0–0.2)
BASO%: 0.3 % (ref 0.0–2.0)
EOS%: 0.6 % (ref 0.0–7.0)
Eosinophils Absolute: 0.1 10*3/uL (ref 0.0–0.5)
HCT: 41.9 % (ref 38.7–49.9)
HGB: 14.4 g/dL (ref 13.0–17.1)
LYMPH#: 1.1 10*3/uL (ref 0.9–3.3)
LYMPH%: 14 % (ref 14.0–48.0)
MCH: 33 pg (ref 28.0–33.4)
MCHC: 34.4 g/dL (ref 32.0–35.9)
MCV: 96 fL (ref 82–98)
MONO#: 0.4 10*3/uL (ref 0.1–0.9)
MONO%: 5.4 % (ref 0.0–13.0)
NEUT#: 6.2 10*3/uL (ref 1.5–6.5)
NEUT%: 79.7 % (ref 40.0–80.0)
PLATELETS: 277 10*3/uL (ref 145–400)
RBC: 4.36 10*6/uL (ref 4.20–5.70)
RDW: 13.9 % (ref 11.1–15.7)
WBC: 7.8 10*3/uL (ref 4.0–10.0)

## 2017-06-16 NOTE — Telephone Encounter (Signed)
This encounter was created in error - please disregard.

## 2017-06-20 ENCOUNTER — Inpatient Hospital Stay: Payer: Medicaid Other | Attending: Hematology & Oncology

## 2017-06-20 DIAGNOSIS — C711 Malignant neoplasm of frontal lobe: Secondary | ICD-10-CM

## 2017-06-20 DIAGNOSIS — C719 Malignant neoplasm of brain, unspecified: Secondary | ICD-10-CM

## 2017-06-20 LAB — CBC WITH DIFFERENTIAL (CANCER CENTER ONLY)
BASO#: 0 10*3/uL (ref 0.0–0.2)
BASO%: 0.2 % (ref 0.0–2.0)
EOS ABS: 0.1 10*3/uL (ref 0.0–0.5)
EOS%: 0.8 % (ref 0.0–7.0)
HCT: 40 % (ref 38.7–49.9)
HEMOGLOBIN: 13.7 g/dL (ref 13.0–17.1)
LYMPH#: 1.2 10*3/uL (ref 0.9–3.3)
LYMPH%: 18.6 % (ref 14.0–48.0)
MCH: 33.3 pg (ref 28.0–33.4)
MCHC: 34.3 g/dL (ref 32.0–35.9)
MCV: 97 fL (ref 82–98)
MONO#: 0.4 10*3/uL (ref 0.1–0.9)
MONO%: 5.9 % (ref 0.0–13.0)
NEUT%: 74.5 % (ref 40.0–80.0)
NEUTROS ABS: 4.8 10*3/uL (ref 1.5–6.5)
Platelets: 255 10*3/uL (ref 145–400)
RBC: 4.11 10*6/uL — ABNORMAL LOW (ref 4.20–5.70)
RDW: 14.3 % (ref 11.1–15.7)
WBC: 6.4 10*3/uL (ref 4.0–10.0)

## 2017-06-20 LAB — CMP (CANCER CENTER ONLY)
ALT(SGPT): 24 U/L (ref 10–47)
AST: 28 U/L (ref 11–38)
Albumin: 3.8 g/dL (ref 3.3–5.5)
Alkaline Phosphatase: 80 U/L (ref 26–84)
BUN, Bld: 9 mg/dL (ref 7–22)
CO2: 29 mEq/L (ref 18–33)
Calcium: 9.6 mg/dL (ref 8.0–10.3)
Chloride: 102 mEq/L (ref 98–108)
Creat: 1.3 mg/dl — ABNORMAL HIGH (ref 0.6–1.2)
Glucose, Bld: 121 mg/dL — ABNORMAL HIGH (ref 73–118)
Potassium: 3.9 mEq/L (ref 3.3–4.7)
Sodium: 140 mEq/L (ref 128–145)
Total Bilirubin: 0.9 mg/dl (ref 0.20–1.60)
Total Protein: 7.1 g/dL (ref 6.4–8.1)

## 2017-06-27 ENCOUNTER — Inpatient Hospital Stay (HOSPITAL_BASED_OUTPATIENT_CLINIC_OR_DEPARTMENT_OTHER): Payer: Medicaid Other

## 2017-06-27 ENCOUNTER — Other Ambulatory Visit: Payer: Self-pay

## 2017-06-27 DIAGNOSIS — C711 Malignant neoplasm of frontal lobe: Secondary | ICD-10-CM | POA: Diagnosis present

## 2017-06-27 DIAGNOSIS — C719 Malignant neoplasm of brain, unspecified: Secondary | ICD-10-CM

## 2017-06-27 LAB — CBC WITH DIFFERENTIAL (CANCER CENTER ONLY)
BASO#: 0 10*3/uL (ref 0.0–0.2)
BASO%: 0.2 % (ref 0.0–2.0)
EOS%: 0.8 % (ref 0.0–7.0)
Eosinophils Absolute: 0.1 10*3/uL (ref 0.0–0.5)
HCT: 42.1 % (ref 38.7–49.9)
HGB: 14.6 g/dL (ref 13.0–17.1)
LYMPH#: 1.5 10*3/uL (ref 0.9–3.3)
LYMPH%: 24.7 % (ref 14.0–48.0)
MCH: 32.9 pg (ref 28.0–33.4)
MCHC: 34.7 g/dL (ref 32.0–35.9)
MCV: 95 fL (ref 82–98)
MONO#: 0.8 10*3/uL (ref 0.1–0.9)
MONO%: 14 % — ABNORMAL HIGH (ref 0.0–13.0)
NEUT#: 3.6 10*3/uL (ref 1.5–6.5)
NEUT%: 60.3 % (ref 40.0–80.0)
PLATELETS: 177 10*3/uL (ref 145–400)
RBC: 4.44 10*6/uL (ref 4.20–5.70)
RDW: 14.8 % (ref 11.1–15.7)
WBC: 5.9 10*3/uL (ref 4.0–10.0)

## 2017-06-27 LAB — CMP (CANCER CENTER ONLY)
ALT: 15 U/L (ref 10–47)
AST: 20 U/L (ref 11–38)
Albumin: 3.8 g/dL (ref 3.3–5.5)
Alkaline Phosphatase: 70 U/L (ref 26–84)
BUN: 13 mg/dL (ref 7–22)
CHLORIDE: 104 meq/L (ref 98–108)
CO2: 27 meq/L (ref 18–33)
Calcium: 9.4 mg/dL (ref 8.0–10.3)
Creat: 1.5 mg/dl — ABNORMAL HIGH (ref 0.6–1.2)
GLUCOSE: 96 mg/dL (ref 73–118)
POTASSIUM: 3.6 meq/L (ref 3.3–4.7)
Sodium: 141 mEq/L (ref 128–145)
Total Bilirubin: 1.1 mg/dl (ref 0.20–1.60)
Total Protein: 7.1 g/dL (ref 6.4–8.1)

## 2017-06-27 MED ORDER — MORPHINE SULFATE ER 30 MG PO TBCR: 30.0000 mg | EXTENDED_RELEASE_TABLET | Freq: Two times a day (BID) | ORAL | 0 refills | Status: DC

## 2017-07-04 ENCOUNTER — Inpatient Hospital Stay: Payer: Medicaid Other

## 2017-07-04 ENCOUNTER — Inpatient Hospital Stay (HOSPITAL_BASED_OUTPATIENT_CLINIC_OR_DEPARTMENT_OTHER): Payer: Medicaid Other

## 2017-07-04 ENCOUNTER — Ambulatory Visit (HOSPITAL_BASED_OUTPATIENT_CLINIC_OR_DEPARTMENT_OTHER): Payer: Medicaid Other | Admitting: Hematology & Oncology

## 2017-07-04 DIAGNOSIS — C719 Malignant neoplasm of brain, unspecified: Secondary | ICD-10-CM

## 2017-07-04 DIAGNOSIS — C711 Malignant neoplasm of frontal lobe: Secondary | ICD-10-CM

## 2017-07-04 LAB — CMP (CANCER CENTER ONLY)
ALBUMIN: 4.1 g/dL (ref 3.3–5.5)
ALT: 17 U/L (ref 10–47)
AST: 23 U/L (ref 11–38)
Alkaline Phosphatase: 76 U/L (ref 26–84)
BUN: 15 mg/dL (ref 7–22)
CHLORIDE: 105 meq/L (ref 98–108)
CO2: 27 meq/L (ref 18–33)
CREATININE: 1.1 mg/dL (ref 0.6–1.2)
Calcium: 10.2 mg/dL (ref 8.0–10.3)
Glucose, Bld: 102 mg/dL (ref 73–118)
POTASSIUM: 4.1 meq/L (ref 3.3–4.7)
Sodium: 142 mEq/L (ref 128–145)
TOTAL PROTEIN: 7.8 g/dL (ref 6.4–8.1)
Total Bilirubin: 1 mg/dl (ref 0.20–1.60)

## 2017-07-04 LAB — CBC WITH DIFFERENTIAL (CANCER CENTER ONLY)
BASO#: 0 10*3/uL (ref 0.0–0.2)
BASO%: 0.4 % (ref 0.0–2.0)
EOS ABS: 0 10*3/uL (ref 0.0–0.5)
EOS%: 0.6 % (ref 0.0–7.0)
HCT: 42.5 % (ref 38.7–49.9)
HGB: 15 g/dL (ref 13.0–17.1)
LYMPH#: 1.7 10*3/uL (ref 0.9–3.3)
LYMPH%: 30.7 % (ref 14.0–48.0)
MCH: 33.3 pg (ref 28.0–33.4)
MCHC: 35.3 g/dL (ref 32.0–35.9)
MCV: 94 fL (ref 82–98)
MONO#: 0.6 10*3/uL (ref 0.1–0.9)
MONO%: 10.3 % (ref 0.0–13.0)
NEUT%: 58 % (ref 40.0–80.0)
NEUTROS ABS: 3.2 10*3/uL (ref 1.5–6.5)
Platelets: 54 10*3/uL — ABNORMAL LOW (ref 145–400)
RBC: 4.51 10*6/uL (ref 4.20–5.70)
RDW: 14.7 % (ref 11.1–15.7)
WBC: 5.4 10*3/uL (ref 4.0–10.0)

## 2017-07-04 MED ORDER — ALPRAZOLAM 1 MG PO TABS: 1.0000 mg | ORAL_TABLET | Freq: Four times a day (QID) | ORAL | 0 refills | Status: DC

## 2017-07-04 NOTE — Progress Notes (Signed)
Hematology and Oncology Follow Up Visit  Zachary Farrell [id number] 04-02-1981 36 y.o. 07/04/2017   Principle Diagnosis:   Recurrent glioblastoma multiforme of the right frontal lobe  Current Therapy:    Lomustine 220 mg po q 6 week     Interim History:  Zachary Farrell is back for follow-up. He is doing okay. He had a decent weekend. Unfortunately, Zachary [place] [employer] lost.  He goes to see Zachary oncologist at [facility] on October 1.  The Megace that we gave him is working quite well. He is eating much better according to Zachary Farrell. However, Zachary weight is down a little bit today.  He's had no diarrhea. He's had no fever. He's had no nausea or vomiting. He's had no cough or shortness of breath. [name] is smoking some marijuana. This is helping him a little bit.  Still has bad headaches. He is on MS Contin for this.  I assume that he will have another MRI of the brain when he sees Zachary oncologist at [facility].  Overall, Zachary performance status is ECOG 1-2. .  Medications:  Current Outpatient Prescriptions:  .  ALPRAZolam (XANAX) 1 MG tablet, Take 1 tablet (1 mg total) by mouth 4 (four) times daily., Disp: 120 tablet, Rfl: 0 .  amitriptyline (ELAVIL) 50 MG tablet, Take by mouth., Disp: , Rfl:  .  famotidine (PEPCID) 20 MG tablet, Take by mouth 2 (two) times daily. , Disp: , Rfl:  .  lamoTRIgine (LAMICTAL) 100 MG tablet, Start taking lamotrigine 50 mg every morning for 2 weeks, increase to 50 mg twice daily for weeks, at week 5 increase to 100 mg twice daily., Disp: , Rfl:  .  lomustine (CEENU) 10 MG capsule, Take 2 capsules (with 2 tabs of 100mg . Total dose 220mg ) by mouth at night on day 1 of each cycle (one dose per 6 week cycle)., Disp: 2 capsule, Rfl: 2 .  lomustine (CEENU) 100 MG capsule, Take 2 capsules (with 2 tab of 10 mg. Total dose 220mg ) by mouth at night on day 1 of each cycle (one dose per 6 week cycle)., Disp: 2 capsule, Rfl: 2 .  megestrol (MEGACE) 400 MG/10ML suspension, Take  20 mLs (800 mg total) by mouth daily., Disp: 600 mL, Rfl: 6 .  methocarbamol (ROBAXIN) 500 MG tablet, Take by mouth 3 (three) times daily. , Disp: , Rfl:  .  morphine (MS CONTIN) 30 MG 12 hr tablet, Take 1 tablet (30 mg total) by mouth every 12 (twelve) hours., Disp: 60 tablet, Rfl: 0 .  ondansetron (ZOFRAN) 8 MG tablet, Take 1 tablet (8 mg) by mouth 1 hour before lomustine on day 1 and every 8 hours as needed for nausea., Disp: , Rfl:  .  oxyCODONE (ROXICODONE) 15 MG immediate release tablet, Take 1 tablet (15 mg total) by mouth every 6 (six) hours as needed., Disp: 120 tablet, Rfl: 0 .  rizatriptan (MAXALT) 10 MG tablet, Take 1 tablet (10 mg total) by mouth as needed for migraine. May repeat in 2 hours if needed, Disp: 10 tablet, Rfl: 0 .  zolpidem (AMBIEN) 10 MG tablet, Take 1 tablet (10 mg total) by mouth at bedtime., Disp: 30 tablet, Rfl: 1  Allergies: No Known Allergies  Past Medical History, Surgical history, Social history, and Family History were reviewed and updated.  Review of Systems: Review of Systems  Constitutional: Negative for appetite change, fatigue, fever and unexpected weight change.  HENT:   Negative for lump/mass, mouth sores, sore throat  and trouble swallowing.   Respiratory: Negative for cough, hemoptysis and shortness of breath.   Cardiovascular: Negative for leg swelling and palpitations.  Gastrointestinal: Negative for abdominal distention, abdominal pain, blood in stool, constipation, diarrhea, nausea and vomiting.  Genitourinary: Negative for bladder incontinence, dysuria, frequency and hematuria.   Musculoskeletal: Negative for arthralgias, back pain, gait problem and myalgias.  Skin: Negative for itching and rash.  Neurological: Negative for dizziness, extremity weakness, gait problem, headaches, numbness, seizures and speech difficulty.  Hematological: Does not bruise/bleed easily.  Psychiatric/Behavioral: Negative for depression and sleep disturbance. The  patient is not nervous/anxious.     Physical Exam:  weight is 158 lb (71.7 kg). Zachary oral temperature is 98.5 F (36.9 C). Zachary blood pressure is 113/80 and Zachary pulse is 98. Zachary respiration is 18 and oxygen saturation is 100%.   Wt Readings from Last 3 Encounters:  07/04/17 158 lb (71.7 kg)  06/06/17 163 lb (73.9 kg)  05/09/17 168 lb 12.8 oz (76.6 kg)    Physical Exam  Constitutional: He is oriented to person, place, and time.  HENT:  Head: Normocephalic and atraumatic.  Mouth/Throat: Oropharynx is clear and moist.  He does have the healed craniotomy scar in the right scalp. This is well-healed.  Eyes: Pupils are equal, round, and reactive to light. EOM are normal.  Neck: Normal range of motion.  Cardiovascular: Normal rate, regular rhythm and normal heart sounds.   Pulmonary/Chest: Effort normal and breath sounds normal.  Abdominal: Soft. Bowel sounds are normal.  Musculoskeletal: Normal range of motion. He exhibits no edema, tenderness or deformity.  Lymphadenopathy:    He has no cervical adenopathy.  Neurological: He is alert and oriented to person, place, and time.  Skin: Skin is warm and dry. No rash noted. No erythema.  Psychiatric: He has a normal mood and affect. Zachary behavior is normal. Judgment and thought content normal.  Vitals reviewed.    Lab Results  Component Value Date   WBC 5.4 07/04/2017   HGB 15.0 07/04/2017   HCT 42.5 07/04/2017   MCV 94 07/04/2017   PLT 54 (L) 07/04/2017     Chemistry      Component Value Date/Time   NA 142 07/04/2017 1419   K 4.1 07/04/2017 1419   CL 105 07/04/2017 1419   CO2 27 07/04/2017 1419   BUN 15 07/04/2017 1419   CREATININE 1.1 07/04/2017 1419      Component Value Date/Time   CALCIUM 10.2 07/04/2017 1419   ALKPHOS 76 07/04/2017 1419   AST 23 07/04/2017 1419   ALT 17 07/04/2017 1419   BILITOT 1.00 07/04/2017 1419         Impression and Plan: Zachary Farrell is a 36 year old white male with a recurrent  glioblastoma. Again, he is followed at [facility] for the most part.   He'll start Zachary next cycle of lomustine after he sees Zachary oncologist at [facility]. I have to believe that they will recheck Zachary MRI and see how he is looking. Hopefully, everything will look stable so he does not have to make a change in Zachary medication.  If, he does have growth, then I'm sure we will get other recommendations for him.  We will have him come back to see [place] in another 4 weeks.  Volanda Napoleon, MD 9/24/20184:07 PM

## 2017-07-05 LAB — LACTATE DEHYDROGENASE: LDH: 154 U/L (ref 125–245)

## 2017-07-11 ENCOUNTER — Inpatient Hospital Stay: Payer: Medicaid Other

## 2017-07-11 ENCOUNTER — Other Ambulatory Visit: Payer: Self-pay | Admitting: *Deleted

## 2017-07-11 DIAGNOSIS — C719 Malignant neoplasm of brain, unspecified: Secondary | ICD-10-CM

## 2017-07-11 MED ORDER — OXYCODONE HCL 15 MG PO TABS: 15.0000 mg | ORAL_TABLET | Freq: Four times a day (QID) | ORAL | 0 refills | Status: DC | PRN

## 2017-07-18 ENCOUNTER — Inpatient Hospital Stay: Payer: Medicaid Other

## 2017-07-19 ENCOUNTER — Telehealth: Payer: Self-pay | Admitting: Pharmacist

## 2017-07-19 ENCOUNTER — Other Ambulatory Visit: Payer: Self-pay | Admitting: *Deleted

## 2017-07-19 DIAGNOSIS — C719 Malignant neoplasm of brain, unspecified: Secondary | ICD-10-CM

## 2017-07-19 MED ORDER — LOMUSTINE 10 MG PO CAPS: ORAL_CAPSULE | ORAL | 2 refills | Status: DC

## 2017-07-19 MED ORDER — LOMUSTINE 100 MG PO CAPS: ORAL_CAPSULE | ORAL | 2 refills | Status: DC

## 2017-07-19 NOTE — Telephone Encounter (Signed)
Oral Oncology Pharmacist Encounter  Received refill prescription for Lomustine for the treatment of recurrent glioblastoma multiforme, planned duration until disease progression or unacceptable drug toxicity. Medication started 01/17/17.  CBC from 07/04/17 assessed, no relevant lab abnormalities. Prescription dose and frequency assessed.   Current medication list in Epic reviewed, no DDIs with Lomustine identified.  Prescription has been e-scribed to the [facility]. Patient has new insurance and will likely .  Oral Oncology Clinic will continue to follow patient.   Darl Pikes, PharmD, BCPS Hematology/Oncology Clinical Pharmacist [facility] [phone]  07/19/2017 4:25 PM

## 2017-07-22 ENCOUNTER — Other Ambulatory Visit: Payer: Self-pay | Admitting: *Deleted

## 2017-07-22 MED ORDER — MORPHINE SULFATE ER 30 MG PO TBCR: 30.0000 mg | EXTENDED_RELEASE_TABLET | Freq: Two times a day (BID) | ORAL | 0 refills | Status: DC

## 2017-07-22 NOTE — Telephone Encounter (Signed)
Oral Oncology Patient Advocate Encounter  Patient now has [employer]. Medicaid will not cover his Lomustine. I was told even if we do a Prior Authorization. They will not cover this drug any more. An appeal will not work.    Did a Lexicographer and sent it to [place] at Dr. Antonieta Pert office to be signed by doctor and patient. She will fax back and I will fax to company.   [place] Patient Advocate [phone] 07/22/2017 1:52 PM

## 2017-07-25 ENCOUNTER — Inpatient Hospital Stay: Payer: Medicaid Other | Attending: Hematology & Oncology

## 2017-07-25 DIAGNOSIS — C719 Malignant neoplasm of brain, unspecified: Secondary | ICD-10-CM

## 2017-07-25 DIAGNOSIS — C711 Malignant neoplasm of frontal lobe: Secondary | ICD-10-CM | POA: Diagnosis present

## 2017-07-25 LAB — CBC WITH DIFFERENTIAL (CANCER CENTER ONLY)
BASO#: 0 10*3/uL (ref 0.0–0.2)
BASO%: 0.4 % (ref 0.0–2.0)
EOS%: 0.4 % (ref 0.0–7.0)
Eosinophils Absolute: 0 10*3/uL (ref 0.0–0.5)
HEMATOCRIT: 40 % (ref 38.7–49.9)
HGB: 13.9 g/dL (ref 13.0–17.1)
LYMPH#: 1.8 10*3/uL (ref 0.9–3.3)
LYMPH%: 66.5 % — AB (ref 14.0–48.0)
MCH: 33.5 pg — ABNORMAL HIGH (ref 28.0–33.4)
MCHC: 34.8 g/dL (ref 32.0–35.9)
MCV: 96 fL (ref 82–98)
MONO#: 0.3 10*3/uL (ref 0.1–0.9)
MONO%: 11.7 % (ref 0.0–13.0)
NEUT#: 0.6 10*3/uL — ABNORMAL LOW (ref 1.5–6.5)
NEUT%: 21 % — AB (ref 40.0–80.0)
Platelets: 118 10*3/uL — ABNORMAL LOW (ref 145–400)
RBC: 4.15 10*6/uL — ABNORMAL LOW (ref 4.20–5.70)
RDW: 14.9 % (ref 11.1–15.7)
WBC: 2.7 10*3/uL — ABNORMAL LOW (ref 4.0–10.0)

## 2017-07-25 LAB — CMP (CANCER CENTER ONLY)
ALT(SGPT): 39 U/L (ref 10–47)
AST: 25 U/L (ref 11–38)
Albumin: 3.8 g/dL (ref 3.3–5.5)
Alkaline Phosphatase: 76 U/L (ref 26–84)
BUN, Bld: 13 mg/dL (ref 7–22)
CALCIUM: 9.6 mg/dL (ref 8.0–10.3)
CO2: 27 meq/L (ref 18–33)
Chloride: 104 mEq/L (ref 98–108)
Creat: 1.1 mg/dl (ref 0.6–1.2)
GLUCOSE: 103 mg/dL (ref 73–118)
POTASSIUM: 4.2 meq/L (ref 3.3–4.7)
Sodium: 144 mEq/L (ref 128–145)
Total Bilirubin: 0.7 mg/dl (ref 0.20–1.60)
Total Protein: 7.5 g/dL (ref 6.4–8.1)

## 2017-07-25 NOTE — Telephone Encounter (Signed)
Oral Oncology Patient Advocate Encounter  Faxed patient application to Dr. Dicie Beam office for pt to complete and Dr. Marin Olp to sign for Lomustine in an effort to reduce patient's out of pocket expense to $0.    Application completed and faxed to [phone].   Patient assistance phone number for follow up is [phone].   This encounter will be updated until final determination.   [place] Patient Advocate [phone] 07/25/2017 3:21 PM

## 2017-07-28 ENCOUNTER — Other Ambulatory Visit: Payer: Self-pay | Admitting: Family Medicine

## 2017-07-28 DIAGNOSIS — G44229 Chronic tension-type headache, not intractable: Secondary | ICD-10-CM

## 2017-07-29 ENCOUNTER — Telehealth: Payer: Self-pay | Admitting: Pharmacist

## 2017-08-01 ENCOUNTER — Inpatient Hospital Stay (HOSPITAL_BASED_OUTPATIENT_CLINIC_OR_DEPARTMENT_OTHER): Payer: Medicaid Other

## 2017-08-01 ENCOUNTER — Ambulatory Visit (HOSPITAL_BASED_OUTPATIENT_CLINIC_OR_DEPARTMENT_OTHER): Payer: Medicaid Other | Admitting: Family

## 2017-08-01 ENCOUNTER — Telehealth: Payer: Self-pay | Admitting: *Deleted

## 2017-08-01 ENCOUNTER — Other Ambulatory Visit: Payer: Self-pay | Admitting: *Deleted

## 2017-08-01 VITALS — BP 123/83 | HR 87 | Temp 98.7°F | Resp 16 | Wt 166.0 lb

## 2017-08-01 DIAGNOSIS — C719 Malignant neoplasm of brain, unspecified: Secondary | ICD-10-CM

## 2017-08-01 DIAGNOSIS — C711 Malignant neoplasm of frontal lobe: Secondary | ICD-10-CM

## 2017-08-01 DIAGNOSIS — R51 Headache: Secondary | ICD-10-CM

## 2017-08-01 LAB — CBC WITH DIFFERENTIAL (CANCER CENTER ONLY)
BASO#: 0 10*3/uL (ref 0.0–0.2)
BASO%: 0.4 % (ref 0.0–2.0)
EOS%: 0.2 % (ref 0.0–7.0)
Eosinophils Absolute: 0 10*3/uL (ref 0.0–0.5)
HEMATOCRIT: 39.1 % (ref 38.7–49.9)
HGB: 13.7 g/dL (ref 13.0–17.1)
LYMPH#: 1.9 10*3/uL (ref 0.9–3.3)
LYMPH%: 40.8 % (ref 14.0–48.0)
MCH: 33.9 pg — ABNORMAL HIGH (ref 28.0–33.4)
MCHC: 35 g/dL (ref 32.0–35.9)
MCV: 97 fL (ref 82–98)
MONO#: 0.7 10*3/uL (ref 0.1–0.9)
MONO%: 15.7 % — AB (ref 0.0–13.0)
NEUT#: 1.9 10*3/uL (ref 1.5–6.5)
NEUT%: 42.9 % (ref 40.0–80.0)
PLATELETS: 251 10*3/uL (ref 145–400)
RBC: 4.04 10*6/uL — ABNORMAL LOW (ref 4.20–5.70)
RDW: 15.4 % (ref 11.1–15.7)
WBC: 4.5 10*3/uL (ref 4.0–10.0)

## 2017-08-01 LAB — CMP (CANCER CENTER ONLY)
ALT(SGPT): 33 U/L (ref 10–47)
AST: 26 U/L (ref 11–38)
Albumin: 3.7 g/dL (ref 3.3–5.5)
Alkaline Phosphatase: 64 U/L (ref 26–84)
BILIRUBIN TOTAL: 0.7 mg/dL (ref 0.20–1.60)
BUN: 8 mg/dL (ref 7–22)
CALCIUM: 9.6 mg/dL (ref 8.0–10.3)
CO2: 29 meq/L (ref 18–33)
Chloride: 108 mEq/L (ref 98–108)
Creat: 1.4 mg/dl — ABNORMAL HIGH (ref 0.6–1.2)
GLUCOSE: 97 mg/dL (ref 73–118)
Potassium: 4.8 mEq/L — ABNORMAL HIGH (ref 3.3–4.7)
Sodium: 141 mEq/L (ref 128–145)
Total Protein: 6.9 g/dL (ref 6.4–8.1)

## 2017-08-01 MED ORDER — OXYCODONE HCL 15 MG PO TABS: 15.0000 mg | ORAL_TABLET | Freq: Four times a day (QID) | ORAL | 0 refills | Status: DC | PRN

## 2017-08-01 MED ORDER — ALPRAZOLAM 1 MG PO TABS
1.0000 mg | ORAL_TABLET | Freq: Four times a day (QID) | ORAL | 1 refills | Status: DC
Start: 2017-08-01 — End: 2017-10-31

## 2017-08-01 NOTE — Telephone Encounter (Signed)
Error

## 2017-08-01 NOTE — Progress Notes (Signed)
Hematology and Oncology Follow Up Visit  Zachary Farrell [id number] 16-Nov-1980 36 y.o. 08/01/2017   Principle Diagnosis:  Recurrent glioblastoma multiforme of the right frontal lobe  Current Therapy:   Lomustine 220 mg po q 6 week   Interim History:  Zachary Farrell is here today with his father for follow-up. Zachary Farrell is working on [employer], mining for his Lomustine. His insurance decided not to cover his medication so now we are going through the drug company to request assistance through their foundation.  He has a headache today and states that he ran out of his Maxalt which helps. He has been taking Tylenol but this has not been much help. He denies changes in vision.  He is feeling fatigued at times and rests some during the day when he can.  He goes back to [facility] on November 19 th and will have repeat scans that same day.  He had a fall last yesterday at 3 am and state that he does not remember what happened. He has an abrasion to the left flank from the fall.  He has had no fever, chills, n/v, cough, rash, SOB, chest pain, palpitations, abdominal pain or changes in bowel or bladder habits.  No swelling or tenderness in his extremities. He has occasional tingling in his left hand that comes and goes.  He has maintained a good appetite and is staying well hydrated. His weight is up 8 lbs since his last visit.    ECOG Performance Status: 2 - Symptomatic, <50% confined to bed  Medications:  Allergies as of 08/01/2017   No Known Allergies     Medication List       Accurate as of 08/01/17  3:39 PM. Always use your most recent med list.          ALPRAZolam 1 MG tablet Commonly known as:  XANAX Take 1 tablet (1 mg total) by mouth 4 (four) times daily.   amitriptyline 50 MG tablet Commonly known as:  ELAVIL Take by mouth.   famotidine 20 MG tablet Commonly known as:  PEPCID Take by mouth 2 (two) times daily.   lamoTRIgine 100 MG tablet Commonly known as:   LAMICTAL Start taking lamotrigine 50 mg every morning for 2 weeks, increase to 50 mg twice daily for weeks, at week 5 increase to 100 mg twice daily.   lomustine 10 MG capsule Commonly known as:  CEENU Take 2 capsules (with 2 tabs of 100mg . Total dose 220mg ) by mouth at night on day 1 of each cycle (one dose per 6 week cycle).   lomustine 100 MG capsule Commonly known as:  CEENU Take 2 capsules (with 2 tab of 10 mg. Total dose 220mg ) by mouth at night on day 1 of each cycle (one dose per 6 week cycle).   megestrol 400 MG/10ML suspension Commonly known as:  MEGACE Take 20 mLs (800 mg total) by mouth daily.   methocarbamol 500 MG tablet Commonly known as:  ROBAXIN Take by mouth 3 (three) times daily.   morphine 30 MG 12 hr tablet Commonly known as:  MS CONTIN Take 1 tablet (30 mg total) by mouth every 12 (twelve) hours.   ondansetron 8 MG tablet Commonly known as:  ZOFRAN Take 1 tablet (8 mg) by mouth 1 hour before lomustine on day 1 and every 8 hours as needed for nausea.   oxyCODONE 15 MG immediate release tablet Commonly known as:  ROXICODONE Take 1 tablet (15 mg total) by mouth every  6 (six) hours as needed.   rizatriptan 10 MG tablet Commonly known as:  MAXALT TAKE 1 TABLET BY MOUTH AS NEEDED FOR MIGRAINE. MAY REPEAT IN 2 HOURS IF NEEDED   zolpidem 10 MG tablet Commonly known as:  AMBIEN Take 1 tablet (10 mg total) by mouth at bedtime.       Allergies: No Known Allergies  Past Medical History, Surgical history, Social history, and Family History were reviewed and updated.  Review of Systems: All other 10 point review of systems is negative.   Physical Exam:  weight is 166 lb (75.3 kg). His oral temperature is 98.7 F (37.1 C). His blood pressure is 123/83 and his pulse is 87. His respiration is 16 and oxygen saturation is 100%.   Wt Readings from Last 3 Encounters:  08/01/17 166 lb (75.3 kg)  07/04/17 158 lb (71.7 kg)  06/06/17 163 lb (73.9 kg)    Ocular:  Sclerae unicteric, pupils equal, round and reactive to light Ear-nose-throat: Oropharynx clear, dentition fair Lymphatic: No cervical, supraclavicular or axillary adenopathy Lungs no rales or rhonchi, good excursion bilaterally Heart regular rate and rhythm, no murmur appreciated Abd soft, nontender, positive bowel sounds, no liver or spleen tip palpated on exam, no fluid wave  MSK no focal spinal tenderness, no joint edema Neuro: non-focal, well-oriented, appropriate affect Breasts: Deferred   Lab Results  Component Value Date   WBC 4.5 08/01/2017   HGB 13.7 08/01/2017   HCT 39.1 08/01/2017   MCV 97 08/01/2017   PLT 251 08/01/2017   No results found for: FERRITIN, IRON, TIBC, UIBC, IRONPCTSAT Lab Results  Component Value Date   RBC 4.04 (L) 08/01/2017   No results found for: KPAFRELGTCHN, LAMBDASER, KAPLAMBRATIO No results found for: IGGSERUM, IGA, IGMSERUM No results found for: Odetta Pink, SPEI   Chemistry      Component Value Date/Time   NA 141 08/01/2017 1438   K 4.8 (H) 08/01/2017 1438   CL 108 08/01/2017 1438   CO2 29 08/01/2017 1438   BUN 8 08/01/2017 1438   CREATININE 1.4 (H) 08/01/2017 1438      Component Value Date/Time   CALCIUM 9.6 08/01/2017 1438   ALKPHOS 64 08/01/2017 1438   AST 26 08/01/2017 1438   ALT 33 08/01/2017 1438   BILITOT 0.70 08/01/2017 1438      Impression and Plan: Zachary Farrell is a 36 yo caucasian gentleman with recurrent glioblastoma. He is followed by Zachary Farrell and will have a visit with scans again on November 19th.  We are working to get him coverage for the Lomustine since he insurance will no longer cover. Hopefully, we will hear back from the drug company in the next few days.  We will go ahead and plan to see him back again in another 4 weeks for follow-up and labs.  Both he and his father know to contact our office with any questions or concerns. We can certainly see him sooner if  need be.   Eliezer Bottom, NP 10/22/20183:39 PM

## 2017-08-02 ENCOUNTER — Other Ambulatory Visit: Payer: Self-pay | Admitting: *Deleted

## 2017-08-02 ENCOUNTER — Telehealth: Payer: Self-pay | Admitting: Family Medicine

## 2017-08-02 DIAGNOSIS — C719 Malignant neoplasm of brain, unspecified: Secondary | ICD-10-CM

## 2017-08-02 LAB — LACTATE DEHYDROGENASE: LDH: 192 U/L (ref 125–245)

## 2017-08-02 MED ORDER — LOMUSTINE 100 MG PO CAPS: ORAL_CAPSULE | ORAL | 2 refills | Status: DC

## 2017-08-02 MED ORDER — LOMUSTINE 10 MG PO CAPS: ORAL_CAPSULE | ORAL | 2 refills | Status: DC

## 2017-08-02 NOTE — Telephone Encounter (Signed)
Oral Oncology Patient Advocate Encounter  Called [employer] to make sure they have every thing they need to approve patients application. No answer. L/M  Called patients dad to let him know I had contacted them. Will keep following up.   [place] Patient Advocate [phone] 08/02/2017 3:05 PM

## 2017-08-02 NOTE — Telephone Encounter (Signed)
Oral Oncology Patient Advocate Encounter  Called [employer] about Mr. Konicki's application. They said they never received. I refaxed to a different number. I asked could they please rush this application. Patient is without medication. She will call me back. ( Mysel P.) [phone] Fax    [facility] Patient Advocate [phone] 08/02/2017 11:42 AM

## 2017-08-02 NOTE — Telephone Encounter (Signed)
Oral Oncology Patient Advocate Encounter   Spoke with [employer] about Mr. Zachary Farrell. Per Mysel P. He has been approved for 3 cycles. We should be contacted by [facility] this week for delivery to the center.  Called patients dad to let him know the good news.     [name] Patient Advocate [phone] 08/02/2017 3:47 PM

## 2017-08-02 NOTE — Telephone Encounter (Signed)
Oral Chemotherapy Pharmacist Encounter  [place] to check on the application status for Zachary Farrell's lomustine. I was unable to speak with a representative. LVM for them to call me back.    Darl Pikes, PharmD, BCPS Hematology/Oncology Clinical Pharmacist [facility] [phone]  08/02/2017 9:28 AM

## 2017-08-03 NOTE — Telephone Encounter (Signed)
Oral Oncology Patient Advocate Encounter  Per [facility] they will ship patients medication to Dr. Antonieta Pert office. Verified the address with them.    [place] Patient Advocate [phone] 08/03/2017 3:44 PM

## 2017-08-08 ENCOUNTER — Other Ambulatory Visit: Payer: Self-pay | Admitting: *Deleted

## 2017-08-08 ENCOUNTER — Inpatient Hospital Stay (HOSPITAL_BASED_OUTPATIENT_CLINIC_OR_DEPARTMENT_OTHER): Payer: Medicaid Other

## 2017-08-08 DIAGNOSIS — C719 Malignant neoplasm of brain, unspecified: Secondary | ICD-10-CM

## 2017-08-08 DIAGNOSIS — C711 Malignant neoplasm of frontal lobe: Secondary | ICD-10-CM

## 2017-08-08 LAB — CBC WITH DIFFERENTIAL (CANCER CENTER ONLY)
BASO#: 0 10*3/uL (ref 0.0–0.2)
BASO%: 0.3 % (ref 0.0–2.0)
EOS%: 0.5 % (ref 0.0–7.0)
Eosinophils Absolute: 0 10*3/uL (ref 0.0–0.5)
HCT: 40.3 % (ref 38.7–49.9)
HEMOGLOBIN: 13.9 g/dL (ref 13.0–17.1)
LYMPH#: 2.2 10*3/uL (ref 0.9–3.3)
LYMPH%: 25.8 % (ref 14.0–48.0)
MCH: 33.7 pg — AB (ref 28.0–33.4)
MCHC: 34.5 g/dL (ref 32.0–35.9)
MCV: 98 fL (ref 82–98)
MONO#: 0.8 10*3/uL (ref 0.1–0.9)
MONO%: 9.4 % (ref 0.0–13.0)
NEUT%: 64 % (ref 40.0–80.0)
NEUTROS ABS: 5.5 10*3/uL (ref 1.5–6.5)
PLATELETS: 275 10*3/uL (ref 145–400)
RBC: 4.13 10*6/uL — AB (ref 4.20–5.70)
RDW: 15.6 % (ref 11.1–15.7)
WBC: 8.6 10*3/uL (ref 4.0–10.0)

## 2017-08-08 LAB — CMP (CANCER CENTER ONLY)
ALBUMIN: 3.7 g/dL (ref 3.3–5.5)
ALK PHOS: 68 U/L (ref 26–84)
ALT(SGPT): 29 U/L (ref 10–47)
AST: 25 U/L (ref 11–38)
BUN: 15 mg/dL (ref 7–22)
CO2: 28 mEq/L (ref 18–33)
CREATININE: 0.9 mg/dL (ref 0.6–1.2)
Calcium: 9.5 mg/dL (ref 8.0–10.3)
Chloride: 105 mEq/L (ref 98–108)
GLUCOSE: 96 mg/dL (ref 73–118)
Potassium: 4.3 mEq/L (ref 3.3–4.7)
SODIUM: 136 meq/L (ref 128–145)
TOTAL PROTEIN: 7.4 g/dL (ref 6.4–8.1)
Total Bilirubin: 0.9 mg/dl (ref 0.20–1.60)

## 2017-08-08 MED ORDER — MORPHINE SULFATE ER 30 MG PO TBCR: 30.0000 mg | EXTENDED_RELEASE_TABLET | Freq: Two times a day (BID) | ORAL | 0 refills | Status: DC

## 2017-08-15 ENCOUNTER — Inpatient Hospital Stay: Payer: Medicaid Other

## 2017-08-16 ENCOUNTER — Other Ambulatory Visit: Payer: Self-pay | Admitting: *Deleted

## 2017-08-16 DIAGNOSIS — C719 Malignant neoplasm of brain, unspecified: Secondary | ICD-10-CM

## 2017-08-16 MED ORDER — DRONABINOL 5 MG PO CAPS: 10.0000 mg | ORAL_CAPSULE | Freq: Two times a day (BID) | ORAL | 2 refills | Status: AC

## 2017-08-22 ENCOUNTER — Inpatient Hospital Stay: Payer: Medicaid Other

## 2017-08-29 ENCOUNTER — Inpatient Hospital Stay: Payer: Medicaid Other

## 2017-08-29 ENCOUNTER — Other Ambulatory Visit: Payer: Self-pay

## 2017-08-29 DIAGNOSIS — C719 Malignant neoplasm of brain, unspecified: Secondary | ICD-10-CM

## 2017-08-29 MED ORDER — OXYCODONE HCL 15 MG PO TABS: 15.0000 mg | ORAL_TABLET | Freq: Four times a day (QID) | ORAL | 0 refills | Status: DC | PRN

## 2017-09-02 ENCOUNTER — Other Ambulatory Visit: Payer: Self-pay | Admitting: Family Medicine

## 2017-09-02 DIAGNOSIS — C719 Malignant neoplasm of brain, unspecified: Secondary | ICD-10-CM

## 2017-09-05 ENCOUNTER — Ambulatory Visit (HOSPITAL_COMMUNITY)
Admission: RE | Admit: 2017-09-05 | Discharge: 2017-09-05 | Disposition: A | Discharge status: Home / Self Care | Payer: Medicaid Other | Source: Ambulatory Visit | Attending: Hematology & Oncology | Admitting: Hematology & Oncology

## 2017-09-05 ENCOUNTER — Encounter: Payer: Self-pay | Admitting: Family

## 2017-09-05 ENCOUNTER — Telehealth: Payer: Self-pay | Admitting: *Deleted

## 2017-09-05 ENCOUNTER — Inpatient Hospital Stay: Payer: Medicaid Other | Attending: Hematology & Oncology

## 2017-09-05 ENCOUNTER — Other Ambulatory Visit: Payer: Self-pay

## 2017-09-05 ENCOUNTER — Ambulatory Visit (HOSPITAL_BASED_OUTPATIENT_CLINIC_OR_DEPARTMENT_OTHER): Payer: Medicaid Other | Admitting: Family

## 2017-09-05 VITALS — BP 125/79 | HR 102 | Temp 98.7°F | Resp 16 | Wt 177.0 lb

## 2017-09-05 DIAGNOSIS — C719 Malignant neoplasm of brain, unspecified: Secondary | ICD-10-CM

## 2017-09-05 DIAGNOSIS — D696 Thrombocytopenia, unspecified: Secondary | ICD-10-CM | POA: Diagnosis not present

## 2017-09-05 DIAGNOSIS — C711 Malignant neoplasm of frontal lobe: Secondary | ICD-10-CM | POA: Diagnosis present

## 2017-09-05 DIAGNOSIS — R7989 Other specified abnormal findings of blood chemistry: Secondary | ICD-10-CM | POA: Diagnosis not present

## 2017-09-05 DIAGNOSIS — R5383 Other fatigue: Secondary | ICD-10-CM | POA: Diagnosis not present

## 2017-09-05 LAB — CMP (CANCER CENTER ONLY)
ALBUMIN: 3.9 g/dL (ref 3.3–5.5)
ALK PHOS: 93 U/L — AB (ref 26–84)
ALT(SGPT): 101 U/L — ABNORMAL HIGH (ref 10–47)
AST: 63 U/L — AB (ref 11–38)
BUN, Bld: 13 mg/dL (ref 7–22)
CO2: 27 mEq/L (ref 18–33)
Calcium: 9.5 mg/dL (ref 8.0–10.3)
Chloride: 104 mEq/L (ref 98–108)
Creat: 1.1 mg/dl (ref 0.6–1.2)
Glucose, Bld: 98 mg/dL (ref 73–118)
Potassium: 3.6 mEq/L (ref 3.3–4.7)
SODIUM: 144 meq/L (ref 128–145)
TOTAL PROTEIN: 7.1 g/dL (ref 6.4–8.1)
Total Bilirubin: 1.2 mg/dl (ref 0.20–1.60)

## 2017-09-05 LAB — CBC WITH DIFFERENTIAL (CANCER CENTER ONLY)
BASO#: 0 10*3/uL (ref 0.0–0.2)
BASO%: 0.2 % (ref 0.0–2.0)
EOS ABS: 0.1 10*3/uL (ref 0.0–0.5)
EOS%: 2.2 % (ref 0.0–7.0)
HCT: 33.1 % — ABNORMAL LOW (ref 38.7–49.9)
HEMOGLOBIN: 11.4 g/dL — AB (ref 13.0–17.1)
LYMPH#: 1.6 10*3/uL (ref 0.9–3.3)
LYMPH%: 26.6 % (ref 14.0–48.0)
MCH: 35.6 pg — AB (ref 28.0–33.4)
MCHC: 34.4 g/dL (ref 32.0–35.9)
MCV: 103 fL — ABNORMAL HIGH (ref 82–98)
MONO#: 0.2 10*3/uL (ref 0.1–0.9)
MONO%: 2.5 % (ref 0.0–13.0)
NEUT%: 68.5 % (ref 40.0–80.0)
NEUTROS ABS: 4.1 10*3/uL (ref 1.5–6.5)
PLATELETS: 7 10*3/uL — AB (ref 145–400)
RBC: 3.2 10*6/uL — AB (ref 4.20–5.70)
RDW: 14.7 % (ref 11.1–15.7)
WBC: 6 10*3/uL (ref 4.0–10.0)

## 2017-09-05 LAB — ABO/RH: ABO/RH(D): O POS

## 2017-09-05 LAB — TECHNOLOGIST REVIEW CHCC SATELLITE

## 2017-09-05 NOTE — Telephone Encounter (Signed)
Last OV 7//30/2018 Last refill on 06/03/2017  #30 with 1 refill

## 2017-09-05 NOTE — Telephone Encounter (Signed)
Critical Value Platelets 7 Dr Ennever notified. Orders will be placed 

## 2017-09-05 NOTE — Progress Notes (Signed)
Hematology and Oncology Follow Up Visit  Zachary Farrell [id number] 13-Dec-1980 36 y.o. 09/05/2017   Principle Diagnosis:  Recurrent glioblastoma multiforme of the right frontal lobe  Current Therapy:   Lomustine 220 mg po q 6 week   Interim History:  Zachary Farrell is here today with his father for follow-up. He is doing fairly well. He states that his back and head pain are managed effectively with his current treatment regimen. This has not gotten any worse.  He still has fatigue.  He had one fall 2 weeks ago where he states that he stood up to quickly and got dizzy.  His MRI of the brain last week with [facility] showed stable postsurgical changes. No definite evidence of recurrent disease.  His last dose of Lonsurf was the last week of October. He is waiting for his next dose to be delivered in the next week or so.  His platelet count is down to 7 but thankfully he is asymptomatic with this. Hgb is stable at No episodes of bleeding, bruising or petechiae.  No fever, chills, n/v, cough, rash, SOB, chest pain, palpitations, abdominal pain or changes in bowel or bladder habits.  No lymphadenopathy found on exam.  No swelling, tenderness or numbness in his extremities. He has some tingling in his left hand that comes and goes. This is unchanged.  He has a good appetite and is staying hydrated. His weight is stable.   ECOG Performance Status: 2 - Symptomatic, <50% confined to bed  Medications:  Allergies as of 09/05/2017   No Known Allergies     Medication List        Accurate as of 09/05/17  3:31 PM. Always use your most recent med list.          ALPRAZolam 1 MG tablet Commonly known as:  XANAX Take 1 tablet (1 mg total) by mouth 4 (four) times daily.   amitriptyline 50 MG tablet Commonly known as:  ELAVIL Take by mouth.   dronabinol 5 MG capsule Commonly known as:  MARINOL Take 2 capsules (10 mg total) 2 (two) times daily by mouth.   famotidine 20 MG tablet Commonly known  as:  PEPCID Take by mouth 2 (two) times daily.   lamoTRIgine 100 MG tablet Commonly known as:  LAMICTAL Start taking lamotrigine 50 mg every morning for 2 weeks, increase to 50 mg twice daily for weeks, at week 5 increase to 100 mg twice daily.   lomustine 10 MG capsule Commonly known as:  CEENU Take 2 capsules (with 2 tabs of 100mg . Total dose 220mg ) by mouth at night on day 1 of each cycle (one dose per 6 week cycle).   lomustine 100 MG capsule Commonly known as:  CEENU Take 2 capsules (with 2 tab of 10 mg. Total dose 220mg ) by mouth at night on day 1 of each cycle (one dose per 6 week cycle).   megestrol 400 MG/10ML suspension Commonly known as:  MEGACE Take 20 mLs (800 mg total) by mouth daily.   methocarbamol 500 MG tablet Commonly known as:  ROBAXIN Take by mouth 3 (three) times daily.   morphine 30 MG 12 hr tablet Commonly known as:  MS CONTIN Take 1 tablet (30 mg total) by mouth every 12 (twelve) hours.   ondansetron 8 MG tablet Commonly known as:  ZOFRAN Take 1 tablet (8 mg) by mouth 1 hour before lomustine on day 1 and every 8 hours as needed for nausea.   oxyCODONE 15 MG immediate  release tablet Commonly known as:  ROXICODONE Take 1 tablet (15 mg total) every 6 (six) hours as needed by mouth.   rizatriptan 10 MG tablet Commonly known as:  MAXALT TAKE 1 TABLET BY MOUTH AS NEEDED FOR MIGRAINE. MAY REPEAT IN 2 HOURS IF NEEDED   zolpidem 10 MG tablet Commonly known as:  AMBIEN TAKE 1 TABLET BY MOUTH EVERY NIGHT AT BEDTIME       Allergies: No Known Allergies  Past Medical History, Surgical history, Social history, and Family History were reviewed and updated.  Review of Systems: All other 10 point review of systems is negative.   Physical Exam:  weight is 177 lb (80.3 kg). His oral temperature is 98.7 F (37.1 C). His blood pressure is 125/79 and his pulse is 102 (abnormal). His respiration is 16 and oxygen saturation is 100%.   Wt Readings from Last 3  Encounters:  09/05/17 177 lb (80.3 kg)  08/01/17 166 lb (75.3 kg)  07/04/17 158 lb (71.7 kg)    Ocular: Sclerae unicteric, pupils equal, round and reactive to light Ear-nose-throat: Oropharynx clear, dentition fair Lymphatic: No cervical, supraclavicular or axillary adenopathy Lungs no rales or rhonchi, good excursion bilaterally Heart regular rate and rhythm, no murmur appreciated Abd soft, nontender, positive bowel sounds, no liver or spleen tip palpated on exam, no fluid wave  MSK no focal spinal tenderness, no joint edema Neuro: non-focal, well-oriented, appropriate affect Breasts: Deferred   Lab Results  Component Value Date   WBC 6.0 09/05/2017   HGB 11.4 (L) 09/05/2017   HCT 33.1 (L) 09/05/2017   MCV 103 (H) 09/05/2017   PLT 7 (LL) 09/05/2017   No results found for: FERRITIN, IRON, TIBC, UIBC, IRONPCTSAT Lab Results  Component Value Date   RBC 3.20 (L) 09/05/2017   No results found for: KPAFRELGTCHN, LAMBDASER, KAPLAMBRATIO No results found for: IGGSERUM, IGA, IGMSERUM No results found for: Ronnald Ramp, A1GS, A2GS, Violet Baldy, MSPIKE, SPEI   Chemistry      Component Value Date/Time   NA 144 09/05/2017 1410   K 3.6 09/05/2017 1410   CL 104 09/05/2017 1410   CO2 27 09/05/2017 1410   BUN 13 09/05/2017 1410   CREATININE 1.1 09/05/2017 1410      Component Value Date/Time   CALCIUM 9.5 09/05/2017 1410   ALKPHOS 93 (H) 09/05/2017 1410   AST 63 (H) 09/05/2017 1410   ALT 101 (H) 09/05/2017 1410   BILITOT 1.20 09/05/2017 1410      Impression and Plan: Zachary Farrell is a pleasant 36 yo caucasian gentleman with recurrent glioblastoma. He continues to follow-up frequently with [facility] and MRI of the brain last week showed stable postsurgical changes, no definite evidence of recurrent disease.  He is waiting for his next dose of Lonsurf to be delivered.    His platelet count is now 7 so we will give him platelets tomorrow. LFT's are elevated as well. So  far he is asymptomatic with this. We will recheck tomorrow.  We will see him back Friday for repeat labs and plan to see him back again in another 3 weeks for follow-up.  Both he and his father know to contact our office with any questions or concerns. We can certainly see him sooner if need be.   Eliezer Bottom, NP 11/26/20183:31 PM

## 2017-09-06 ENCOUNTER — Other Ambulatory Visit (HOSPITAL_BASED_OUTPATIENT_CLINIC_OR_DEPARTMENT_OTHER): Payer: Medicaid Other

## 2017-09-06 ENCOUNTER — Telehealth: Payer: Self-pay | Admitting: Hematology & Oncology

## 2017-09-06 ENCOUNTER — Other Ambulatory Visit: Payer: Self-pay | Admitting: Family

## 2017-09-06 ENCOUNTER — Ambulatory Visit: Payer: Medicaid Other

## 2017-09-06 DIAGNOSIS — C719 Malignant neoplasm of brain, unspecified: Secondary | ICD-10-CM

## 2017-09-06 DIAGNOSIS — C711 Malignant neoplasm of frontal lobe: Secondary | ICD-10-CM

## 2017-09-06 DIAGNOSIS — D696 Thrombocytopenia, unspecified: Secondary | ICD-10-CM

## 2017-09-06 DIAGNOSIS — K219 Gastro-esophageal reflux disease without esophagitis: Secondary | ICD-10-CM

## 2017-09-06 DIAGNOSIS — R066 Hiccough: Secondary | ICD-10-CM

## 2017-09-06 LAB — CBC WITH DIFFERENTIAL (CANCER CENTER ONLY)
BASO#: 0 10*3/uL (ref 0.0–0.2)
BASO%: 0.2 % (ref 0.0–2.0)
EOS%: 2 % (ref 0.0–7.0)
Eosinophils Absolute: 0.1 10*3/uL (ref 0.0–0.5)
HCT: 31.7 % — ABNORMAL LOW (ref 38.7–49.9)
HGB: 11.1 g/dL — ABNORMAL LOW (ref 13.0–17.1)
LYMPH#: 1.3 10*3/uL (ref 0.9–3.3)
LYMPH%: 21.5 % (ref 14.0–48.0)
MCH: 36.3 pg — ABNORMAL HIGH (ref 28.0–33.4)
MCHC: 35 g/dL (ref 32.0–35.9)
MCV: 104 fL — AB (ref 82–98)
MONO#: 0.1 10*3/uL (ref 0.1–0.9)
MONO%: 2.4 % (ref 0.0–13.0)
NEUT#: 4.3 10*3/uL (ref 1.5–6.5)
NEUT%: 73.9 % (ref 40.0–80.0)
PLATELETS: 13 10*3/uL — AB (ref 145–400)
RBC: 3.06 10*6/uL — AB (ref 4.20–5.70)
RDW: 14.8 % (ref 11.1–15.7)
WBC: 5.9 10*3/uL (ref 4.0–10.0)

## 2017-09-06 LAB — CMP (CANCER CENTER ONLY)
ALK PHOS: 94 U/L — AB (ref 26–84)
ALT: 100 U/L — AB (ref 10–47)
AST: 54 U/L — AB (ref 11–38)
Albumin: 3.9 g/dL (ref 3.3–5.5)
BILIRUBIN TOTAL: 1.1 mg/dL (ref 0.20–1.60)
BUN: 18 mg/dL (ref 7–22)
CO2: 27 mEq/L (ref 18–33)
CREATININE: 1.1 mg/dL (ref 0.6–1.2)
Calcium: 9.6 mg/dL (ref 8.0–10.3)
Chloride: 105 mEq/L (ref 98–108)
GLUCOSE: 114 mg/dL (ref 73–118)
POTASSIUM: 4.3 meq/L (ref 3.3–4.7)
Sodium: 144 mEq/L (ref 128–145)
TOTAL PROTEIN: 7.3 g/dL (ref 6.4–8.1)

## 2017-09-06 LAB — LACTATE DEHYDROGENASE: LDH: 220 U/L (ref 125–245)

## 2017-09-06 MED ORDER — SODIUM CHLORIDE 0.9 % IV SOLN: 250.0000 mL | Freq: Once | INTRAVENOUS | Status: DC

## 2017-09-06 MED ORDER — DIPHENHYDRAMINE HCL 25 MG PO CAPS
25.0000 mg | ORAL_CAPSULE | Freq: Once | ORAL | Status: AC
  Administered 2017-09-06: 25 mg via ORAL

## 2017-09-06 MED ORDER — BACLOFEN 5 MG PO TABS: 5.0000 mg | ORAL_TABLET | Freq: Three times a day (TID) | ORAL | 3 refills | Status: DC | PRN

## 2017-09-06 MED ORDER — ACETAMINOPHEN 325 MG PO TABS
ORAL_TABLET | ORAL | Status: AC
  Filled 2017-09-06: qty 2

## 2017-09-06 MED ORDER — DIPHENHYDRAMINE HCL 25 MG PO CAPS
ORAL_CAPSULE | ORAL | Status: AC
  Filled 2017-09-06: qty 1

## 2017-09-06 MED ORDER — FUROSEMIDE 10 MG/ML IJ SOLN: 20.0000 mg | Freq: Once | INTRAMUSCULAR | Status: DC

## 2017-09-06 MED ORDER — PANTOPRAZOLE SODIUM 40 MG PO TBEC: 40.0000 mg | DELAYED_RELEASE_TABLET | Freq: Every day | ORAL | 3 refills | Status: DC

## 2017-09-06 MED ORDER — ACETAMINOPHEN 325 MG PO TABS
650.0000 mg | ORAL_TABLET | Freq: Once | ORAL | Status: AC
  Administered 2017-09-06: 650 mg via ORAL

## 2017-09-06 NOTE — Patient Instructions (Signed)

## 2017-09-06 NOTE — Telephone Encounter (Signed)
Faxed medical records to: [facility] Kyung Rudd: [phone] CASE: [id number]

## 2017-09-06 NOTE — Telephone Encounter (Signed)
Oral Oncology Patient Advocate Encounter  Did patients re-enrollment for Lomustine thru [employer].  Will fax paper work out on 09/10/2017 for year 2019.  Fax # [phone] [facility] Patient Advocate [phone] 09/06/2017 11:23 AM

## 2017-09-06 NOTE — Progress Notes (Signed)
Patient will start baclofen as needed for hiccups and and Protonix for GERD.

## 2017-09-07 ENCOUNTER — Ambulatory Visit (INDEPENDENT_AMBULATORY_CARE_PROVIDER_SITE_OTHER): Payer: Medicaid Other | Admitting: Family Medicine

## 2017-09-07 ENCOUNTER — Telehealth: Payer: Self-pay | Admitting: Hematology & Oncology

## 2017-09-07 ENCOUNTER — Encounter: Payer: Self-pay | Admitting: Family Medicine

## 2017-09-07 VITALS — BP 104/74 | HR 116 | Temp 98.7°F | Ht 74.0 in | Wt 177.4 lb

## 2017-09-07 DIAGNOSIS — G4700 Insomnia, unspecified: Secondary | ICD-10-CM | POA: Diagnosis not present

## 2017-09-07 DIAGNOSIS — S61209A Unspecified open wound of unspecified finger without damage to nail, initial encounter: Secondary | ICD-10-CM

## 2017-09-07 LAB — BPAM PLATELET PHERESIS
BLOOD PRODUCT EXPIRATION DATE: 201811302359
ISSUE DATE / TIME: 201811270834
UNIT TYPE AND RH: 5100

## 2017-09-07 LAB — PREPARE PLATELET PHERESIS: UNIT DIVISION: 0

## 2017-09-07 MED ORDER — ZOLPIDEM TARTRATE ER 12.5 MG PO TBCR
12.5000 mg | EXTENDED_RELEASE_TABLET | Freq: Every evening | ORAL | 0 refills | Status: DC | PRN
Start: 2017-09-07 — End: 2017-09-26

## 2017-09-07 MED ORDER — SILVER SULFADIAZINE 1 % EX CREA: 1.0000 "application " | TOPICAL_CREAM | Freq: Every day | CUTANEOUS | 0 refills | Status: DC

## 2017-09-07 NOTE — Patient Instructions (Addendum)
Things to look out for: fevers, drainage, spreading redness, foul odor.   If things are not better in 11 days (3 weeks of wound), send me a picture and MyChart message and we will send you to a wound specialist.   Come back if you are not getting good sleep and we can discuss other options.

## 2017-09-07 NOTE — Progress Notes (Signed)
Chief Complaint  Patient presents with  . Suture / Staple Removal    Zachary Farrell is a 36 y.o. male here for a skin complaint. Here w dad. Asking for sutures.   Duration: 10 days Location: palmar 3rd digit, prox phalanx Pruritic? No Painful? Yes Drainage? No Cut on a sugar bowl Therapies tried thus far: super glue, butterfly bandages  Hx of insomnia related to glioblastoma. Is unable to sleep for more than 2-3 hours before waking up despite Xanax and Ambien. Requesting an increase in Xanax.   ROS:  Const: No fevers Skin: As noted in HPI  Past Medical History:  Diagnosis Date  . Cancer (HCC)    Glioblastoma Grade 4  . Depression   . GERD (gastroesophageal reflux disease)   . History of chicken pox   . Migraine   . Seizures (Ord)    No Known Allergies Allergies as of 09/07/2017   No Known Allergies     Medication List        Accurate as of 09/07/17  2:43 PM. Always use your most recent med list.          ALPRAZolam 1 MG tablet Commonly known as:  XANAX Take 1 tablet (1 mg total) by mouth 4 (four) times daily.   amitriptyline 50 MG tablet Commonly known as:  ELAVIL Take by mouth.   Baclofen 5 MG Tabs Take 5 mg by mouth 3 (three) times daily as needed for muscle spasms.   dronabinol 5 MG capsule Commonly known as:  MARINOL Take 2 capsules (10 mg total) 2 (two) times daily by mouth.   lamoTRIgine 100 MG tablet Commonly known as:  LAMICTAL Start taking lamotrigine 50 mg every morning for 2 weeks, increase to 50 mg twice daily for weeks, at week 5 increase to 100 mg twice daily.   lomustine 10 MG capsule Commonly known as:  CEENU Take 2 capsules (with 2 tabs of 100mg . Total dose 220mg ) by mouth at night on day 1 of each cycle (one dose per 6 week cycle).   lomustine 100 MG capsule Commonly known as:  CEENU Take 2 capsules (with 2 tab of 10 mg. Total dose 220mg ) by mouth at night on day 1 of each cycle (one dose per 6 week cycle).   megestrol 400  MG/10ML suspension Commonly known as:  MEGACE Take 20 mLs (800 mg total) by mouth daily.   methocarbamol 500 MG tablet Commonly known as:  ROBAXIN Take by mouth 3 (three) times daily.   morphine 30 MG 12 hr tablet Commonly known as:  MS CONTIN Take 1 tablet (30 mg total) by mouth every 12 (twelve) hours.   ondansetron 8 MG tablet Commonly known as:  ZOFRAN Take 1 tablet (8 mg) by mouth 1 hour before lomustine on day 1 and every 8 hours as needed for nausea.   oxyCODONE 15 MG immediate release tablet Commonly known as:  ROXICODONE Take 1 tablet (15 mg total) every 6 (six) hours as needed by mouth.   pantoprazole 40 MG tablet Commonly known as:  PROTONIX Take 1 tablet (40 mg total) by mouth daily.   rizatriptan 10 MG tablet Commonly known as:  MAXALT TAKE 1 TABLET BY MOUTH AS NEEDED FOR MIGRAINE. MAY REPEAT IN 2 HOURS IF NEEDED   silver sulfADIAZINE 1 % cream Commonly known as:  SILVADENE Apply 1 application topically daily.   zolpidem 12.5 MG CR tablet Commonly known as:  AMBIEN CR Take 1 tablet (12.5 mg total) by  mouth at bedtime as needed for sleep.       BP 104/74 (BP Location: Left Arm, Patient Position: Sitting, Cuff Size: Normal)   Pulse (!) 116   Temp 98.7 F (37.1 C) (Oral)   Ht 6\' 2"  (1.88 m)   Wt 177 lb 6 oz (80.5 kg)   SpO2 98%   BMI 22.77 kg/m  Gen: awake, alert, appearing stated age Lungs: No accessory muscle use Skin: Palmar surface of prox phalanx on R 3rd digit, there is a 1 cm gash with FB (super glue) over the opening. No drainage, erythema, TTP, fluctuance, excoriation Psych: Age appropriate judgment and insight, flat affect  Open wound of finger, initial encounter  Insomnia, unspecified type  Silvadene to prevent infection. Send message in 11 days if no better, will refer to wound care. He is higher risk 2/2 low platelets due to chemotherapy. Warning s/s's verbalized and written.  For insomnia, will change Ambien to XR version. Could try  Lunesta vs Belsomra vs trazodone. F/u prn. The patient voiced understanding and agreement to the plan.  [place], DO 09/07/17 2:43 PM

## 2017-09-07 NOTE — Telephone Encounter (Signed)
Oral Oncology Patient Advocate Encounter  Per Roselyn Reef at Dr. Antonieta Pert office said that they received his Lomustine yesterday. Patient father to pick up today.  [place] Patient Advocate [phone] 09/07/2017 11:27 AM

## 2017-09-08 ENCOUNTER — Other Ambulatory Visit: Payer: Self-pay | Admitting: *Deleted

## 2017-09-08 DIAGNOSIS — C719 Malignant neoplasm of brain, unspecified: Secondary | ICD-10-CM

## 2017-09-09 ENCOUNTER — Other Ambulatory Visit: Payer: Self-pay | Admitting: *Deleted

## 2017-09-09 ENCOUNTER — Other Ambulatory Visit (HOSPITAL_BASED_OUTPATIENT_CLINIC_OR_DEPARTMENT_OTHER): Payer: Medicaid Other

## 2017-09-09 DIAGNOSIS — C711 Malignant neoplasm of frontal lobe: Secondary | ICD-10-CM

## 2017-09-09 DIAGNOSIS — C719 Malignant neoplasm of brain, unspecified: Secondary | ICD-10-CM

## 2017-09-09 LAB — CBC WITH DIFFERENTIAL (CANCER CENTER ONLY)
BASO#: 0 10*3/uL (ref 0.0–0.2)
BASO%: 0 % (ref 0.0–2.0)
EOS ABS: 0.1 10*3/uL (ref 0.0–0.5)
EOS%: 1.7 % (ref 0.0–7.0)
HCT: 28.2 % — ABNORMAL LOW (ref 38.7–49.9)
HEMOGLOBIN: 9.7 g/dL — AB (ref 13.0–17.1)
LYMPH#: 1.3 10*3/uL (ref 0.9–3.3)
LYMPH%: 44.9 % (ref 14.0–48.0)
MCH: 35.8 pg — AB (ref 28.0–33.4)
MCHC: 34.4 g/dL (ref 32.0–35.9)
MCV: 104 fL — AB (ref 82–98)
MONO#: 0.1 10*3/uL (ref 0.1–0.9)
MONO%: 3.5 % (ref 0.0–13.0)
NEUT#: 1.4 10*3/uL — ABNORMAL LOW (ref 1.5–6.5)
NEUT%: 49.9 % (ref 40.0–80.0)
Platelets: 33 10*3/uL — ABNORMAL LOW (ref 145–400)
RBC: 2.71 10*6/uL — AB (ref 4.20–5.70)
RDW: 14.3 % (ref 11.1–15.7)
WBC: 2.9 10*3/uL — AB (ref 4.0–10.0)

## 2017-09-09 LAB — CMP (CANCER CENTER ONLY)
ALT(SGPT): 79 U/L — ABNORMAL HIGH (ref 10–47)
AST: 39 U/L — ABNORMAL HIGH (ref 11–38)
Albumin: 3.7 g/dL (ref 3.3–5.5)
Alkaline Phosphatase: 109 U/L — ABNORMAL HIGH (ref 26–84)
BUN: 18 mg/dL (ref 7–22)
CHLORIDE: 100 meq/L (ref 98–108)
CO2: 30 meq/L (ref 18–33)
Calcium: 9.4 mg/dL (ref 8.0–10.3)
Creat: 1.2 mg/dl (ref 0.6–1.2)
Glucose, Bld: 106 mg/dL (ref 73–118)
POTASSIUM: 4.1 meq/L (ref 3.3–4.7)
Sodium: 146 mEq/L — ABNORMAL HIGH (ref 128–145)
TOTAL PROTEIN: 7.1 g/dL (ref 6.4–8.1)
Total Bilirubin: 0.8 mg/dl (ref 0.20–1.60)

## 2017-09-09 MED ORDER — MORPHINE SULFATE ER 30 MG PO TBCR: 30.0000 mg | EXTENDED_RELEASE_TABLET | Freq: Two times a day (BID) | ORAL | 0 refills | Status: DC

## 2017-09-12 ENCOUNTER — Inpatient Hospital Stay: Payer: Medicaid Other | Attending: Hematology & Oncology

## 2017-09-12 ENCOUNTER — Encounter: Payer: Self-pay | Admitting: Hematology & Oncology

## 2017-09-12 ENCOUNTER — Other Ambulatory Visit: Payer: Self-pay | Admitting: Family

## 2017-09-12 DIAGNOSIS — C719 Malignant neoplasm of brain, unspecified: Secondary | ICD-10-CM

## 2017-09-12 DIAGNOSIS — C711 Malignant neoplasm of frontal lobe: Secondary | ICD-10-CM | POA: Diagnosis present

## 2017-09-12 LAB — CMP (CANCER CENTER ONLY)
ALBUMIN: 3.6 g/dL (ref 3.3–5.5)
ALK PHOS: 101 U/L — AB (ref 26–84)
ALT(SGPT): 48 U/L — ABNORMAL HIGH (ref 10–47)
AST: 28 U/L (ref 11–38)
BUN, Bld: 11 mg/dL (ref 7–22)
CHLORIDE: 104 meq/L (ref 98–108)
CO2: 29 mEq/L (ref 18–33)
CREATININE: 1.1 mg/dL (ref 0.6–1.2)
Calcium: 9.3 mg/dL (ref 8.0–10.3)
Glucose, Bld: 95 mg/dL (ref 73–118)
Potassium: 4.1 mEq/L (ref 3.3–4.7)
SODIUM: 146 meq/L — AB (ref 128–145)
TOTAL PROTEIN: 6.7 g/dL (ref 6.4–8.1)
Total Bilirubin: 0.8 mg/dl (ref 0.20–1.60)

## 2017-09-12 LAB — CBC WITH DIFFERENTIAL (CANCER CENTER ONLY)
BASO#: 0 10*3/uL (ref 0.0–0.2)
BASO%: 0 % (ref 0.0–2.0)
EOS ABS: 0 10*3/uL (ref 0.0–0.5)
EOS%: 1.4 % (ref 0.0–7.0)
HCT: 27.3 % — ABNORMAL LOW (ref 38.7–49.9)
HGB: 9.3 g/dL — ABNORMAL LOW (ref 13.0–17.1)
LYMPH#: 1.2 10*3/uL (ref 0.9–3.3)
LYMPH%: 58.3 % — AB (ref 14.0–48.0)
MCH: 35.9 pg — AB (ref 28.0–33.4)
MCHC: 34.1 g/dL (ref 32.0–35.9)
MCV: 105 fL — AB (ref 82–98)
MONO#: 0.2 10*3/uL (ref 0.1–0.9)
MONO%: 9.5 % (ref 0.0–13.0)
NEUT#: 0.7 10*3/uL — ABNORMAL LOW (ref 1.5–6.5)
NEUT%: 30.8 % — AB (ref 40.0–80.0)
PLATELETS: 43 10*3/uL — AB (ref 145–400)
RBC: 2.59 10*6/uL — ABNORMAL LOW (ref 4.20–5.70)
RDW: 14.8 % (ref 11.1–15.7)
WBC: 2.1 10*3/uL — AB (ref 4.0–10.0)

## 2017-09-13 ENCOUNTER — Telehealth: Payer: Self-pay | Admitting: *Deleted

## 2017-09-13 DIAGNOSIS — I82409 Acute embolism and thrombosis of unspecified deep veins of unspecified lower extremity: Secondary | ICD-10-CM | POA: Insufficient documentation

## 2017-09-13 NOTE — Telephone Encounter (Signed)
Patient's father would like to know if the patient should take his scheduled dose of lomustine today. Patient had labs drawn yesterday.   Reviewed labs with Dr Marin Olp. Patient is to hold lomustine. We will recheck labs next week to determine when to administer dose.   Father aware of instructions. He will not administer dose. Appointment confirmed for next week.

## 2017-09-15 ENCOUNTER — Inpatient Hospital Stay: Payer: Medicaid Other | Admitting: Family Medicine

## 2017-09-19 ENCOUNTER — Ambulatory Visit: Payer: Medicaid Other | Admitting: Family

## 2017-09-19 ENCOUNTER — Inpatient Hospital Stay: Payer: Medicaid Other

## 2017-09-20 ENCOUNTER — Other Ambulatory Visit: Payer: Self-pay | Admitting: *Deleted

## 2017-09-20 ENCOUNTER — Other Ambulatory Visit (HOSPITAL_BASED_OUTPATIENT_CLINIC_OR_DEPARTMENT_OTHER): Payer: Medicaid Other

## 2017-09-20 ENCOUNTER — Telehealth: Payer: Self-pay

## 2017-09-20 DIAGNOSIS — C711 Malignant neoplasm of frontal lobe: Secondary | ICD-10-CM

## 2017-09-20 DIAGNOSIS — C719 Malignant neoplasm of brain, unspecified: Secondary | ICD-10-CM

## 2017-09-20 LAB — CMP (CANCER CENTER ONLY)
ALBUMIN: 3.5 g/dL (ref 3.3–5.5)
ALK PHOS: 126 U/L — AB (ref 26–84)
ALT(SGPT): 36 U/L (ref 10–47)
AST: 31 U/L (ref 11–38)
BILIRUBIN TOTAL: 0.7 mg/dL (ref 0.20–1.60)
BUN: 12 mg/dL (ref 7–22)
CALCIUM: 9.1 mg/dL (ref 8.0–10.3)
CO2: 28 mEq/L (ref 18–33)
CREATININE: 1.1 mg/dL (ref 0.6–1.2)
Chloride: 104 mEq/L (ref 98–108)
GLUCOSE: 113 mg/dL (ref 73–118)
Potassium: 3.8 mEq/L (ref 3.3–4.7)
Sodium: 144 mEq/L (ref 128–145)
TOTAL PROTEIN: 6.7 g/dL (ref 6.4–8.1)

## 2017-09-20 LAB — CBC WITH DIFFERENTIAL (CANCER CENTER ONLY)
BASO#: 0 10*3/uL (ref 0.0–0.2)
BASO%: 0.5 % (ref 0.0–2.0)
EOS ABS: 0 10*3/uL (ref 0.0–0.5)
EOS%: 0.3 % (ref 0.0–7.0)
HCT: 29.2 % — ABNORMAL LOW (ref 38.7–49.9)
HEMOGLOBIN: 9.9 g/dL — AB (ref 13.0–17.1)
LYMPH#: 1 10*3/uL (ref 0.9–3.3)
LYMPH%: 26.5 % (ref 14.0–48.0)
MCH: 36.4 pg — AB (ref 28.0–33.4)
MCHC: 33.9 g/dL (ref 32.0–35.9)
MCV: 107 fL — AB (ref 82–98)
MONO#: 0.6 10*3/uL (ref 0.1–0.9)
MONO%: 15.1 % — ABNORMAL HIGH (ref 0.0–13.0)
NEUT%: 57.6 % (ref 40.0–80.0)
NEUTROS ABS: 2.2 10*3/uL (ref 1.5–6.5)
Platelets: 134 10*3/uL — ABNORMAL LOW (ref 145–400)
RBC: 2.72 10*6/uL — AB (ref 4.20–5.70)
RDW: 16.4 % — ABNORMAL HIGH (ref 11.1–15.7)
WBC: 3.9 10*3/uL — AB (ref 4.0–10.0)

## 2017-09-20 NOTE — Telephone Encounter (Signed)
Spoke with pt's father re: today's lab results. Mr Hunnicutt aware of the improvement in Zachary Farrell' placement. Per Dr Marin Olp, pt to resume taking Lomustine. Mr Zachary Farrell understanding and repeats back instructions. dph

## 2017-09-26 ENCOUNTER — Other Ambulatory Visit: Payer: Self-pay | Admitting: *Deleted

## 2017-09-26 ENCOUNTER — Ambulatory Visit (HOSPITAL_BASED_OUTPATIENT_CLINIC_OR_DEPARTMENT_OTHER): Payer: Medicaid Other | Admitting: Family

## 2017-09-26 ENCOUNTER — Encounter: Payer: Self-pay | Admitting: Family

## 2017-09-26 ENCOUNTER — Other Ambulatory Visit: Payer: Self-pay

## 2017-09-26 ENCOUNTER — Inpatient Hospital Stay (HOSPITAL_BASED_OUTPATIENT_CLINIC_OR_DEPARTMENT_OTHER): Payer: Medicaid Other

## 2017-09-26 VITALS — BP 111/67 | HR 95 | Temp 98.9°F | Resp 16 | Wt 176.0 lb

## 2017-09-26 DIAGNOSIS — I82442 Acute embolism and thrombosis of left tibial vein: Secondary | ICD-10-CM

## 2017-09-26 DIAGNOSIS — C719 Malignant neoplasm of brain, unspecified: Secondary | ICD-10-CM

## 2017-09-26 DIAGNOSIS — R51 Headache: Secondary | ICD-10-CM | POA: Diagnosis not present

## 2017-09-26 DIAGNOSIS — I82432 Acute embolism and thrombosis of left popliteal vein: Secondary | ICD-10-CM

## 2017-09-26 DIAGNOSIS — I82412 Acute embolism and thrombosis of left femoral vein: Secondary | ICD-10-CM | POA: Diagnosis not present

## 2017-09-26 DIAGNOSIS — R42 Dizziness and giddiness: Secondary | ICD-10-CM | POA: Diagnosis not present

## 2017-09-26 DIAGNOSIS — G4700 Insomnia, unspecified: Secondary | ICD-10-CM | POA: Diagnosis not present

## 2017-09-26 DIAGNOSIS — D696 Thrombocytopenia, unspecified: Secondary | ICD-10-CM

## 2017-09-26 DIAGNOSIS — C711 Malignant neoplasm of frontal lobe: Secondary | ICD-10-CM

## 2017-09-26 DIAGNOSIS — G4701 Insomnia due to medical condition: Secondary | ICD-10-CM

## 2017-09-26 LAB — CMP (CANCER CENTER ONLY)
ALK PHOS: 93 U/L — AB (ref 26–84)
ALT: 46 U/L (ref 10–47)
AST: 32 U/L (ref 11–38)
Albumin: 3.8 g/dL (ref 3.3–5.5)
BUN: 12 mg/dL (ref 7–22)
CO2: 27 mEq/L (ref 18–33)
CREATININE: 1 mg/dL (ref 0.6–1.2)
Calcium: 9.8 mg/dL (ref 8.0–10.3)
Chloride: 104 mEq/L (ref 98–108)
GLUCOSE: 93 mg/dL (ref 73–118)
POTASSIUM: 4.1 meq/L (ref 3.3–4.7)
SODIUM: 145 meq/L (ref 128–145)
TOTAL PROTEIN: 7.4 g/dL (ref 6.4–8.1)
Total Bilirubin: 1 mg/dl (ref 0.20–1.60)

## 2017-09-26 LAB — CBC WITH DIFFERENTIAL (CANCER CENTER ONLY)
BASO#: 0 10*3/uL (ref 0.0–0.2)
BASO%: 0.5 % (ref 0.0–2.0)
EOS%: 0.2 % (ref 0.0–7.0)
Eosinophils Absolute: 0 10*3/uL (ref 0.0–0.5)
HCT: 32.8 % — ABNORMAL LOW (ref 38.7–49.9)
HGB: 11.1 g/dL — ABNORMAL LOW (ref 13.0–17.1)
LYMPH#: 1.6 10*3/uL (ref 0.9–3.3)
LYMPH%: 27.5 % (ref 14.0–48.0)
MCH: 36.5 pg — ABNORMAL HIGH (ref 28.0–33.4)
MCHC: 33.8 g/dL (ref 32.0–35.9)
MCV: 108 fL — ABNORMAL HIGH (ref 82–98)
MONO#: 0.6 10*3/uL (ref 0.1–0.9)
MONO%: 10.9 % (ref 0.0–13.0)
NEUT#: 3.5 10*3/uL (ref 1.5–6.5)
NEUT%: 60.9 % (ref 40.0–80.0)
PLATELETS: 211 10*3/uL (ref 145–400)
RBC: 3.04 10*6/uL — AB (ref 4.20–5.70)
RDW: 16.1 % — AB (ref 11.1–15.7)
WBC: 5.7 10*3/uL (ref 4.0–10.0)

## 2017-09-26 MED ORDER — OXYCODONE HCL 15 MG PO TABS: 15.0000 mg | ORAL_TABLET | Freq: Four times a day (QID) | ORAL | 0 refills | Status: DC | PRN

## 2017-09-26 MED ORDER — SUVOREXANT 10 MG PO TABS: 10.0000 mg | ORAL_TABLET | Freq: Every day | ORAL | 0 refills | Status: DC

## 2017-09-26 NOTE — Progress Notes (Signed)
Hematology and Oncology Follow Up Visit  BELMONT VALLI [id number] [birth year] 36 y.o. 09/26/2017   Principle Diagnosis:  Recurrent glioblastoma multiforme of the right frontal lobe  Current Therapy:   Lomustine 220 mg po q 6 week   Interim History:  Mr. Volkov is here today with his father for follow-up. He states that he is not sleeping despite the Ambien and Elavil. He has not slept in 3-4 days.  He is still having headaches and intermittent dizziness if he stands to quickly. No alls or syncope.  No bleeding or petechiae. He states that he bruises easily but not in excess. His platelet count is now 211 and Hgb 11.1.  Both he and his father were sick last week with a stomach virus where they vomited quite a bit. His stomach is still a little sore from this.  He denies feeling dehydrated or needing fluids. He has a good appetite now and his weight is stable.  No fever, chills, cough, rash, dizziness, SOB, chest pain, palpitations or changes in bowel or bladder habits.  He was positive for a non-obstructive DVT within the left distal femoral vein, popliteal and one of the paired posterior tibial veins. The swelling in his left lower extremity has resolved. He is doing well on Xarelto. Pedal pulses are +2.  He follows up with his team at [facility] on January January 4th. He took his Lomustine on December 12th.  No lymphadenopathy found on exam.   The numbness and tingling in his left hand and thigh is unchanged.   ECOG Performance Status: 2 - Symptomatic, <50% confined to bed  Medications:  Allergies as of 09/26/2017   No Known Allergies     Medication List        Accurate as of 09/26/17  2:47 PM. Always use your most recent med list.          ALPRAZolam 1 MG tablet Commonly known as:  XANAX Take 1 tablet (1 mg total) by mouth 4 (four) times daily.   amitriptyline 50 MG tablet Commonly known as:  ELAVIL Take by mouth.   Baclofen 5 MG Tabs Take 5 mg by mouth 3 (three) times  daily as needed for muscle spasms.   dronabinol 5 MG capsule Commonly known as:  MARINOL Take 2 capsules (10 mg total) 2 (two) times daily by mouth.   lamoTRIgine 100 MG tablet Commonly known as:  LAMICTAL Start taking lamotrigine 50 mg every morning for 2 weeks, increase to 50 mg twice daily for weeks, at week 5 increase to 100 mg twice daily.   lomustine 10 MG capsule Commonly known as:  CEENU Take 2 capsules (with 2 tabs of 100mg . Total dose 220mg ) by mouth at night on day 1 of each cycle (one dose per 6 week cycle).   lomustine 100 MG capsule Commonly known as:  CEENU Take 2 capsules (with 2 tab of 10 mg. Total dose 220mg ) by mouth at night on day 1 of each cycle (one dose per 6 week cycle).   megestrol 400 MG/10ML suspension Commonly known as:  MEGACE Take 20 mLs (800 mg total) by mouth daily.   methocarbamol 500 MG tablet Commonly known as:  ROBAXIN Take by mouth 3 (three) times daily.   morphine 30 MG 12 hr tablet Commonly known as:  MS CONTIN Take 1 tablet (30 mg total) by mouth every 12 (twelve) hours.   ondansetron 8 MG tablet Commonly known as:  ZOFRAN Take 1 tablet (8 mg) by mouth  1 hour before lomustine on day 1 and every 8 hours as needed for nausea.   oxyCODONE 15 MG immediate release tablet Commonly known as:  ROXICODONE Take 1 tablet (15 mg total) by mouth every 6 (six) hours as needed.   pantoprazole 40 MG tablet Commonly known as:  PROTONIX Take 1 tablet (40 mg total) by mouth daily.   Rivaroxaban 15 & 20 MG Tbpk Take by mouth.   rizatriptan 10 MG tablet Commonly known as:  MAXALT TAKE 1 TABLET BY MOUTH AS NEEDED FOR MIGRAINE. MAY REPEAT IN 2 HOURS IF NEEDED   silver sulfADIAZINE 1 % cream Commonly known as:  SILVADENE Apply 1 application topically daily.   zolpidem 10 MG tablet Commonly known as:  AMBIEN Take by mouth.       Allergies: No Known Allergies  Past Medical History, Surgical history, Social history, and Family History were  reviewed and updated.  Review of Systems: All other 10 point review of systems is negative.   Physical Exam:  vitals were not taken for this visit.   Wt Readings from Last 3 Encounters:  09/07/17 177 lb 6 oz (80.5 kg)  09/05/17 177 lb (80.3 kg)  08/01/17 166 lb (75.3 kg)    Ocular: Sclerae unicteric, pupils equal, round and reactive to light Ear-nose-throat: Oropharynx clear, dentition fair Lymphatic: No cervical, supraclavicular or axillary adenopathy Lungs no rales or rhonchi, good excursion bilaterally Heart regular rate and rhythm, no murmur appreciated Abd soft, nontender, positive bowel sounds, no liver or spleen tip palpated on exam, no fluid wave MSK no focal spinal tenderness, no joint edema Neuro: non-focal, well-oriented, appropriate affect Breasts: Deferred   Lab Results  Component Value Date   WBC 5.7 09/26/2017   HGB 11.1 (L) 09/26/2017   HCT 32.8 (L) 09/26/2017   MCV 108 (H) 09/26/2017   PLT 211 09/26/2017   No results found for: FERRITIN, IRON, TIBC, UIBC, IRONPCTSAT Lab Results  Component Value Date   RBC 3.04 (L) 09/26/2017   No results found for: KPAFRELGTCHN, LAMBDASER, KAPLAMBRATIO No results found for: IGGSERUM, IGA, IGMSERUM No results found for: Ronnald Ramp, A1GS, A2GS, Violet Baldy, MSPIKE, SPEI   Chemistry      Component Value Date/Time   NA 144 09/20/2017 1258   K 3.8 09/20/2017 1258   CL 104 09/20/2017 1258   CO2 28 09/20/2017 1258   BUN 12 09/20/2017 1258   CREATININE 1.1 09/20/2017 1258      Component Value Date/Time   CALCIUM 9.1 09/20/2017 1258   ALKPHOS 126 (H) 09/20/2017 1258   AST 31 09/20/2017 1258   ALT 36 09/20/2017 1258   BILITOT 0.70 09/20/2017 1258      Impression and Plan: Mr. Lundstrom is a 36 yo caucasian gentleman with recurrent glioblastoma. He is doing fairly well. He states that he woke up last week with swelling in his left calf. He was positive for a nonocclusive DVT in the left lower  extremity and is now on a Xarelto starter pack.  We will repeat another [place] in 2 months for evaluate his response to treatment.  He took his Lomustine on December 12th. He follows up with [facility] again on January 4th.  He states that he is not sleeping despite taking Ativan and Elavil. I spoke with Dr. Marin Olp and we will discontinue these two medications and have him try Belsomra 10 mg at bedtime. Both he and his father verbalized understanding and will contact us if he experiences any adverse effects.  We will plan to see him again in another month for follow-up.  Again, he and his father know to contact our office with any questions or concerns. We can certainly see him sooner if need be.   Laverna Peace, NP 12/17/20182:47 PM

## 2017-09-26 NOTE — Telephone Encounter (Signed)
Error

## 2017-09-27 LAB — LACTATE DEHYDROGENASE: LDH: 242 U/L (ref 125–245)

## 2017-10-03 ENCOUNTER — Inpatient Hospital Stay: Payer: Medicaid Other

## 2017-10-03 ENCOUNTER — Telehealth: Payer: Self-pay | Admitting: *Deleted

## 2017-10-03 NOTE — Telephone Encounter (Signed)
rec'd call from [facility]. They will deliver patient's medication to dr ennever's office on 10-06-17.

## 2017-10-07 ENCOUNTER — Other Ambulatory Visit: Payer: Self-pay | Admitting: *Deleted

## 2017-10-07 MED ORDER — RIVAROXABAN 20 MG PO TABS: 20.0000 mg | ORAL_TABLET | Freq: Every day | ORAL | 6 refills | Status: DC

## 2017-10-10 ENCOUNTER — Other Ambulatory Visit: Payer: Self-pay | Admitting: *Deleted

## 2017-10-10 ENCOUNTER — Other Ambulatory Visit: Payer: Self-pay | Admitting: Family

## 2017-10-10 ENCOUNTER — Inpatient Hospital Stay: Payer: Medicaid Other

## 2017-10-10 DIAGNOSIS — C719 Malignant neoplasm of brain, unspecified: Secondary | ICD-10-CM

## 2017-10-10 MED ORDER — MORPHINE SULFATE ER 30 MG PO TBCR: 30.0000 mg | EXTENDED_RELEASE_TABLET | Freq: Two times a day (BID) | ORAL | 0 refills | Status: DC

## 2017-10-17 ENCOUNTER — Inpatient Hospital Stay: Payer: Medicaid Other

## 2017-10-24 ENCOUNTER — Ambulatory Visit: Payer: Medicaid Other | Admitting: Family

## 2017-10-24 ENCOUNTER — Inpatient Hospital Stay: Payer: Medicaid Other

## 2017-10-26 ENCOUNTER — Other Ambulatory Visit: Payer: Self-pay | Admitting: *Deleted

## 2017-10-26 DIAGNOSIS — C719 Malignant neoplasm of brain, unspecified: Secondary | ICD-10-CM

## 2017-10-26 MED ORDER — LOMUSTINE 100 MG PO CAPS: ORAL_CAPSULE | ORAL | 2 refills | Status: DC

## 2017-10-26 MED ORDER — LOMUSTINE 10 MG PO CAPS: ORAL_CAPSULE | ORAL | 2 refills | Status: AC

## 2017-10-27 ENCOUNTER — Other Ambulatory Visit: Payer: Self-pay | Admitting: Family

## 2017-10-27 DIAGNOSIS — G4701 Insomnia due to medical condition: Secondary | ICD-10-CM

## 2017-10-27 DIAGNOSIS — C719 Malignant neoplasm of brain, unspecified: Secondary | ICD-10-CM

## 2017-10-31 ENCOUNTER — Other Ambulatory Visit: Payer: Self-pay

## 2017-10-31 ENCOUNTER — Inpatient Hospital Stay: Payer: Medicaid Other | Attending: Hematology & Oncology

## 2017-10-31 DIAGNOSIS — C719 Malignant neoplasm of brain, unspecified: Secondary | ICD-10-CM

## 2017-10-31 DIAGNOSIS — C711 Malignant neoplasm of frontal lobe: Secondary | ICD-10-CM | POA: Insufficient documentation

## 2017-10-31 LAB — CMP (CANCER CENTER ONLY)
ALBUMIN: 3.9 g/dL (ref 3.5–5.0)
ALK PHOS: 78 U/L (ref 26–84)
ALT: 27 U/L (ref 0–55)
AST: 21 U/L (ref 5–34)
Anion gap: 11 (ref 5–15)
BILIRUBIN TOTAL: 0.8 mg/dL (ref 0.2–1.2)
BUN: 13 mg/dL (ref 7–22)
CALCIUM: 9.5 mg/dL (ref 8.0–10.3)
CO2: 25 mmol/L (ref 18–33)
CREATININE: 1.3 mg/dL (ref 0.70–1.30)
Chloride: 109 mmol/L — ABNORMAL HIGH (ref 98–108)
Glucose, Bld: 122 mg/dL — ABNORMAL HIGH (ref 70–118)
Potassium: 3.7 mmol/L (ref 3.3–4.7)
Sodium: 145 mmol/L (ref 128–145)
Total Protein: 7.4 g/dL (ref 6.4–8.1)

## 2017-10-31 LAB — CBC WITH DIFFERENTIAL (CANCER CENTER ONLY)
BASOS ABS: 0 10*3/uL (ref 0.0–0.1)
BASOS PCT: 0 %
Eosinophils Absolute: 0 10*3/uL (ref 0.0–0.5)
Eosinophils Relative: 2 %
HEMATOCRIT: 30.8 % — AB (ref 38.7–49.9)
HEMOGLOBIN: 10.5 g/dL — AB (ref 13.0–17.1)
Lymphocytes Relative: 57 %
Lymphs Abs: 1.4 10*3/uL (ref 0.9–3.3)
MCH: 37.2 pg — ABNORMAL HIGH (ref 28.0–33.4)
MCHC: 34.1 g/dL (ref 32.0–35.9)
MCV: 109.2 fL — ABNORMAL HIGH (ref 82.0–98.0)
MONOS PCT: 5 %
Monocytes Absolute: 0.1 10*3/uL (ref 0.1–0.9)
NEUTROS PCT: 36 %
Neutro Abs: 0.9 10*3/uL — ABNORMAL LOW (ref 1.5–6.5)
Platelet Count: 17 10*3/uL — ABNORMAL LOW (ref 140–400)
RBC: 2.82 MIL/uL — ABNORMAL LOW (ref 4.20–5.70)
RDW: 12.9 % (ref 11.1–15.7)
WBC Count: 2.5 10*3/uL — ABNORMAL LOW (ref 4.0–10.3)

## 2017-10-31 MED ORDER — MORPHINE SULFATE ER 30 MG PO TBCR: 30.0000 mg | EXTENDED_RELEASE_TABLET | Freq: Two times a day (BID) | ORAL | 0 refills | Status: DC

## 2017-10-31 MED ORDER — OXYCODONE HCL 15 MG PO TABS: 15.0000 mg | ORAL_TABLET | Freq: Four times a day (QID) | ORAL | 0 refills | Status: DC | PRN

## 2017-10-31 MED ORDER — ALPRAZOLAM 2 MG PO TABS: 2.0000 mg | ORAL_TABLET | Freq: Two times a day (BID) | ORAL | 0 refills | Status: DC | PRN

## 2017-10-31 NOTE — Telephone Encounter (Signed)
Dr Marin Olp aware of platelets of 17. No new orders at this time. dph

## 2017-11-01 ENCOUNTER — Other Ambulatory Visit: Payer: Self-pay | Admitting: *Deleted

## 2017-11-01 DIAGNOSIS — C719 Malignant neoplasm of brain, unspecified: Secondary | ICD-10-CM

## 2017-11-01 DIAGNOSIS — G4701 Insomnia due to medical condition: Secondary | ICD-10-CM

## 2017-11-01 LAB — LACTATE DEHYDROGENASE: LDH: 171 U/L (ref 125–245)

## 2017-11-01 MED ORDER — SUVOREXANT 10 MG PO TABS: 10.0000 mg | ORAL_TABLET | Freq: Every day | ORAL | 2 refills | Status: DC

## 2017-11-07 ENCOUNTER — Inpatient Hospital Stay: Payer: Medicaid Other

## 2017-11-07 ENCOUNTER — Other Ambulatory Visit: Payer: Self-pay | Admitting: *Deleted

## 2017-11-08 ENCOUNTER — Other Ambulatory Visit: Payer: Self-pay | Admitting: Family

## 2017-11-08 ENCOUNTER — Inpatient Hospital Stay: Payer: Medicaid Other

## 2017-11-08 ENCOUNTER — Telehealth: Payer: Self-pay | Admitting: Hematology & Oncology

## 2017-11-08 VITALS — BP 120/80 | HR 86 | Temp 98.0°F | Resp 0

## 2017-11-08 DIAGNOSIS — R5383 Other fatigue: Secondary | ICD-10-CM

## 2017-11-08 DIAGNOSIS — C719 Malignant neoplasm of brain, unspecified: Secondary | ICD-10-CM

## 2017-11-08 DIAGNOSIS — C711 Malignant neoplasm of frontal lobe: Secondary | ICD-10-CM | POA: Diagnosis not present

## 2017-11-08 DIAGNOSIS — G4459 Other complicated headache syndrome: Secondary | ICD-10-CM

## 2017-11-08 LAB — CBC WITH DIFFERENTIAL (CANCER CENTER ONLY)
BASOS PCT: 0 %
Basophils Absolute: 0 10*3/uL (ref 0.0–0.1)
EOS ABS: 0 10*3/uL (ref 0.0–0.5)
EOS PCT: 0 %
HCT: 32.9 % — ABNORMAL LOW (ref 38.7–49.9)
Hemoglobin: 11.5 g/dL — ABNORMAL LOW (ref 13.0–17.1)
Lymphocytes Relative: 50 %
Lymphs Abs: 1.4 10*3/uL (ref 0.9–3.3)
MCH: 37.3 pg — ABNORMAL HIGH (ref 28.0–33.4)
MCHC: 35 g/dL (ref 32.0–35.9)
MCV: 106.8 fL — ABNORMAL HIGH (ref 82.0–98.0)
MONO ABS: 0.5 10*3/uL (ref 0.1–0.9)
MONOS PCT: 17 %
NEUTROS PCT: 33 %
Neutro Abs: 0.9 10*3/uL — ABNORMAL LOW (ref 1.5–6.5)
PLATELETS: 61 10*3/uL — AB (ref 140–400)
RBC: 3.08 MIL/uL — ABNORMAL LOW (ref 4.20–5.70)
RDW: 13.3 % (ref 11.1–15.7)
WBC Count: 2.8 10*3/uL — ABNORMAL LOW (ref 4.0–10.3)

## 2017-11-08 LAB — CMP (CANCER CENTER ONLY)
ALBUMIN: 4.1 g/dL (ref 3.5–5.0)
ALT: 27 U/L (ref 0–55)
AST: 29 U/L (ref 5–34)
Alkaline Phosphatase: 97 U/L — ABNORMAL HIGH (ref 26–84)
Anion gap: 12 (ref 5–15)
BUN: 17 mg/dL (ref 7–22)
CHLORIDE: 105 mmol/L (ref 98–108)
CO2: 28 mmol/L (ref 18–33)
CREATININE: 1.6 mg/dL — AB (ref 0.70–1.30)
Calcium: 10.7 mg/dL — ABNORMAL HIGH (ref 8.0–10.3)
Glucose, Bld: 105 mg/dL (ref 70–118)
POTASSIUM: 4.6 mmol/L (ref 3.3–4.7)
SODIUM: 145 mmol/L (ref 128–145)
Total Bilirubin: 1.4 mg/dL — ABNORMAL HIGH (ref 0.2–1.2)
Total Protein: 8 g/dL (ref 6.4–8.1)

## 2017-11-08 MED ORDER — SODIUM CHLORIDE 0.9 % IV SOLN
Freq: Once | INTRAVENOUS | Status: AC
  Administered 2017-11-08: 15:00:00 via INTRAVENOUS

## 2017-11-08 MED ORDER — SODIUM CHLORIDE 0.9 % IV SOLN
30.0000 mg | Freq: Once | INTRAVENOUS | Status: DC
  Filled 2017-11-08: qty 3

## 2017-11-08 MED ORDER — SODIUM CHLORIDE 0.9 % IV SOLN
30.0000 mg | Freq: Once | INTRAVENOUS | Status: AC
  Administered 2017-11-08: 30 mg via INTRAVENOUS
  Filled 2017-11-08: qty 3

## 2017-11-08 MED ORDER — ACETAMINOPHEN 325 MG PO TABS
650.0000 mg | ORAL_TABLET | Freq: Once | ORAL | Status: AC
  Administered 2017-11-08: 650 mg via ORAL

## 2017-11-08 MED ORDER — FAMOTIDINE IN NACL 20-0.9 MG/50ML-% IV SOLN
40.0000 mg | Freq: Once | INTRAVENOUS | Status: AC
  Administered 2017-11-08: 40 mg via INTRAVENOUS

## 2017-11-08 MED ORDER — ACETAMINOPHEN 325 MG PO TABS: 650.0000 mg | ORAL_TABLET | Freq: Once | ORAL | Status: DC

## 2017-11-08 MED ORDER — FAMOTIDINE IN NACL 20-0.9 MG/50ML-% IV SOLN: 40.0000 mg | Freq: Once | INTRAVENOUS | Status: DC

## 2017-11-08 NOTE — Telephone Encounter (Signed)
Oral Oncology Patient Advocate Encounter  Called to check on application for patients Lomustine. Waiting for them to call me back. Told them I expect a call back today. Have been working on this since 10/26/2017.   [place] Patient Advocate [phone] 11/08/2017 3:10 PM

## 2017-11-08 NOTE — Progress Notes (Signed)
Mr. Jeschke is here today for labs and is feeling more lethargic and weak. He fell yesterday after getting dizzy and has an abrasion to the left back beneath his shoulder from hitting his clothes rack. He does not think he hit his head.  We will do a STAT MRI of the brain to assess for possible bleed. He will get Decadron 30 mg IV and Pepcid 40 mg IV along with IV fluids.  He will stop the Xarelto. Platelet count is 61 and Hgb is stable at 11.5.  We will see what his scan shows and follow-up with him afterwards.

## 2017-11-08 NOTE — Patient Instructions (Addendum)
Dehydration, Adult Dehydration is when there is not enough fluid or water in your body. This happens when you lose more fluids than you take in. Dehydration can range from mild to very bad. It should be treated right away to keep it from getting very bad. Symptoms of mild dehydration may include:  Thirst.  Dry lips.  Slightly dry mouth.  Dry, warm skin.  Dizziness. Symptoms of moderate dehydration may include:  Very dry mouth.  Muscle cramps.  Dark pee (urine). Pee may be the color of tea.  Your body making less pee.  Your eyes making fewer tears.  Heartbeat that is uneven or faster than normal (palpitations).  Headache.  Light-headedness, especially when you stand up from sitting.  Fainting (syncope). Symptoms of very bad dehydration may include:  Changes in skin, such as: ? Cold and clammy skin. ? Blotchy (mottled) or pale skin. ? Skin that does not quickly return to normal after being lightly pinched and let go (poor skin turgor).  Changes in body fluids, such as: ? Feeling very thirsty. ? Your eyes making fewer tears. ? Not sweating when body temperature is high, such as in hot weather. ? Your body making very little pee.  Changes in vital signs, such as: ? Weak pulse. ? Pulse that is more than 100 beats a minute when you are sitting still. ? Fast breathing. ? Low blood pressure.  Other changes, such as: ? Sunken eyes. ? Cold hands and feet. ? Confusion. ? Lack of energy (lethargy). ? Trouble waking up from sleep. ? Short-term weight loss. ? Unconsciousness. Follow these instructions at home:  If told by your doctor, drink an ORS: ? Make an ORS by using instructions on the package. ? Start by drinking small amounts, about  cup (120 mL) every 5-10 minutes. ? Slowly drink more until you have had the amount that your doctor said to have.  Drink enough clear fluid to keep your pee clear or pale yellow. If you were told to drink an ORS, finish the ORS  first, then start slowly drinking clear fluids. Drink fluids such as: ? Water. Do not drink only water by itself. Doing that can make the salt (sodium) level in your body get too low (hyponatremia). ? Ice chips. ? Fruit juice that you have added water to (diluted). ? Low-calorie sports drinks.  Avoid: ? Alcohol. ? Drinks that have a lot of sugar. These include high-calorie sports drinks, fruit juice that does not have water added, and soda. ? Caffeine. ? Foods that are greasy or have a lot of fat or sugar.  Take over-the-counter and prescription medicines only as told by your doctor.  Do not take salt tablets. Doing that can make the salt level in your body get too high (hypernatremia).  Eat foods that have minerals (electrolytes). Examples include bananas, oranges, potatoes, tomatoes, and spinach.  Keep all follow-up visits as told by your doctor. This is important. Contact a doctor if:  You have belly (abdominal) pain that: ? Gets worse. ? Stays in one area (localizes).  You have a rash.  You have a stiff neck.  You get angry or annoyed more easily than normal (irritability).  You are more sleepy than normal.  You have a harder time waking up than normal.  You feel: ? Weak. ? Dizzy. ? Very thirsty.  You have peed (urinated) only a small amount of very dark pee during 6-8 hours. Get help right away if:  You have symptoms of   very bad dehydration.  You cannot drink fluids without throwing up (vomiting).  Your symptoms get worse with treatment.  You have a fever.  You have a very bad headache.  You are throwing up or having watery poop (diarrhea) and it: ? Gets worse. ? Does not go away.  You have blood or something green (bile) in your throw-up.  You have blood in your poop (stool). This may cause poop to look black and tarry.  You have not peed in 6-8 hours.  You pass out (faint).  Your heart rate when you are sitting still is more than 100 beats a  minute.  You have trouble breathing. This information is not intended to replace advice given to you by your health care provider. Make sure you discuss any questions you have with your health care provider. Document Released: 07/24/2009 Document Revised: 04/16/2016 Document Reviewed: 11/21/2015 Elsevier Interactive Patient Education  2018 Elsevier Inc.  Dehydration, Adult Dehydration is when there is not enough fluid or water in your body. This happens when you lose more fluids than you take in. Dehydration can range from mild to very bad. It should be treated right away to keep it from getting very bad. Symptoms of mild dehydration may include:  Thirst.  Dry lips.  Slightly dry mouth.  Dry, warm skin.  Dizziness. Symptoms of moderate dehydration may include:  Very dry mouth.  Muscle cramps.  Dark pee (urine). Pee may be the color of tea.  Your body making less pee.  Your eyes making fewer tears.  Heartbeat that is uneven or faster than normal (palpitations).  Headache.  Light-headedness, especially when you stand up from sitting.  Fainting (syncope). Symptoms of very bad dehydration may include:  Changes in skin, such as: ? Cold and clammy skin. ? Blotchy (mottled) or pale skin. ? Skin that does not quickly return to normal after being lightly pinched and let go (poor skin turgor).  Changes in body fluids, such as: ? Feeling very thirsty. ? Your eyes making fewer tears. ? Not sweating when body temperature is high, such as in hot weather. ? Your body making very little pee.  Changes in vital signs, such as: ? Weak pulse. ? Pulse that is more than 100 beats a minute when you are sitting still. ? Fast breathing. ? Low blood pressure.  Other changes, such as: ? Sunken eyes. ? Cold hands and feet. ? Confusion. ? Lack of energy (lethargy). ? Trouble waking up from sleep. ? Short-term weight loss. ? Unconsciousness. Follow these instructions at  home:  If told by your doctor, drink an ORS: ? Make an ORS by using instructions on the package. ? Start by drinking small amounts, about  cup (120 mL) every 5-10 minutes. ? Slowly drink more until you have had the amount that your doctor said to have.  Drink enough clear fluid to keep your pee clear or pale yellow. If you were told to drink an ORS, finish the ORS first, then start slowly drinking clear fluids. Drink fluids such as: ? Water. Do not drink only water by itself. Doing that can make the salt (sodium) level in your body get too low (hyponatremia). ? Ice chips. ? Fruit juice that you have added water to (diluted). ? Low-calorie sports drinks.  Avoid: ? Alcohol. ? Drinks that have a lot of sugar. These include high-calorie sports drinks, fruit juice that does not have water added, and soda. ? Caffeine. ? Foods that are greasy or have   a lot of fat or sugar.  Take over-the-counter and prescription medicines only as told by your doctor.  Do not take salt tablets. Doing that can make the salt level in your body get too high (hypernatremia).  Eat foods that have minerals (electrolytes). Examples include bananas, oranges, potatoes, tomatoes, and spinach.  Keep all follow-up visits as told by your doctor. This is important. Contact a doctor if:  You have belly (abdominal) pain that: ? Gets worse. ? Stays in one area (localizes).  You have a rash.  You have a stiff neck.  You get angry or annoyed more easily than normal (irritability).  You are more sleepy than normal.  You have a harder time waking up than normal.  You feel: ? Weak. ? Dizzy. ? Very thirsty.  You have peed (urinated) only a small amount of very dark pee during 6-8 hours. Get help right away if:  You have symptoms of very bad dehydration.  You cannot drink fluids without throwing up (vomiting).  Your symptoms get worse with treatment.  You have a fever.  You have a very bad headache.  You  are throwing up or having watery poop (diarrhea) and it: ? Gets worse. ? Does not go away.  You have blood or something green (bile) in your throw-up.  You have blood in your poop (stool). This may cause poop to look black and tarry.  You have not peed in 6-8 hours.  You pass out (faint).  Your heart rate when you are sitting still is more than 100 beats a minute.  You have trouble breathing. This information is not intended to replace advice given to you by your health care provider. Make sure you discuss any questions you have with your health care provider. Document Released: 07/24/2009 Document Revised: 04/16/2016 Document Reviewed: 11/21/2015 Elsevier Interactive Patient Education  2018 Elsevier Inc.  

## 2017-11-10 ENCOUNTER — Telehealth: Payer: Self-pay | Admitting: Family

## 2017-11-10 ENCOUNTER — Ambulatory Visit (HOSPITAL_COMMUNITY)
Admission: RE | Admit: 2017-11-10 | Discharge: 2017-11-10 | Disposition: A | Discharge status: Home / Self Care | Payer: Medicaid Other | Source: Ambulatory Visit | Attending: Family | Admitting: Family

## 2017-11-10 ENCOUNTER — Telehealth: Payer: Self-pay | Admitting: Hematology & Oncology

## 2017-11-10 ENCOUNTER — Other Ambulatory Visit: Payer: Self-pay | Admitting: Family

## 2017-11-10 DIAGNOSIS — R5383 Other fatigue: Secondary | ICD-10-CM | POA: Diagnosis present

## 2017-11-10 DIAGNOSIS — C719 Malignant neoplasm of brain, unspecified: Secondary | ICD-10-CM | POA: Diagnosis not present

## 2017-11-10 DIAGNOSIS — Z9889 Other specified postprocedural states: Secondary | ICD-10-CM | POA: Diagnosis not present

## 2017-11-10 DIAGNOSIS — G44229 Chronic tension-type headache, not intractable: Secondary | ICD-10-CM

## 2017-11-10 MED ORDER — GADOBENATE DIMEGLUMINE 529 MG/ML IV SOLN
20.0000 mL | Freq: Once | INTRAVENOUS | Status: AC | PRN
  Administered 2017-11-10: 16 mL via INTRAVENOUS

## 2017-11-10 MED ORDER — RIZATRIPTAN BENZOATE 10 MG PO TABS: ORAL_TABLET | ORAL | 2 refills | Status: AC

## 2017-11-10 NOTE — Telephone Encounter (Signed)
Oral Oncology Patient Advocate Encounter   Called [employer] to see if they had processed application for Lomustine. I have called several times and was told they would call me back. Never heard back, I left a message today for a return call.   Will continue to follow-up.  [place] Patient Advocate [phone] 11/10/2017 11:58 AM

## 2017-11-10 NOTE — Telephone Encounter (Signed)
I spoke with Mr. Zachary Farrell and let him know his son's MRI of the brain showed no changes. He is still feeling weak from a possible stomach virus last week. He was able to eat some today and is hydrating. His headaches are persistent. I spoke with Dr. Marin Olp and we increased the amount of Maxalt tablets he gets from 10 to 25. We will see if this helps.  They will contact our office with any other questions or concerns.

## 2017-11-11 ENCOUNTER — Telehealth: Payer: Self-pay | Admitting: Hematology & Oncology

## 2017-11-11 ENCOUNTER — Other Ambulatory Visit: Payer: Self-pay | Admitting: *Deleted

## 2017-11-11 ENCOUNTER — Other Ambulatory Visit: Payer: Self-pay | Admitting: Family

## 2017-11-11 DIAGNOSIS — C719 Malignant neoplasm of brain, unspecified: Secondary | ICD-10-CM

## 2017-11-11 DIAGNOSIS — R066 Hiccough: Secondary | ICD-10-CM

## 2017-11-11 MED ORDER — BACLOFEN 5 MG PO TABS: 10.0000 mg | ORAL_TABLET | Freq: Three times a day (TID) | ORAL | 3 refills | Status: DC | PRN

## 2017-11-11 MED ORDER — OXYCODONE HCL 15 MG PO TABS: 15.0000 mg | ORAL_TABLET | ORAL | 0 refills | Status: DC | PRN

## 2017-11-11 NOTE — Progress Notes (Signed)
I spoke again with Zachary Farrell and he does not want to use the Maxalt unless he has to for headaches. I spoke with Dr. Marin Olp and will increase his oxycodone to every 4 hours PRN for breakthrough pain. He is also having hiccups despite the baclofen so we have also increased this to 10 mg PO TID PRN.

## 2017-11-11 NOTE — Telephone Encounter (Signed)
Oral Oncology Patient Advocate Encounter  Received notification from [employer] Patient Assistance program that patient has been successfully enrolled into their program to receive Lomustine from the manufacturer at $0 out of pocket until 10/10/2018. Approval per Marcha Dutton [id number] Ext 3  I called and spoke with patient dad.  He knows we will have to re-apply.   Patient knows to call the office with questions or concerns.  Oral Oncology Clinic will continue to follow.  [place] Patient Advocate [phone] 11/11/2017 10:36 AM

## 2017-11-14 ENCOUNTER — Inpatient Hospital Stay: Payer: Medicaid Other

## 2017-11-15 ENCOUNTER — Inpatient Hospital Stay: Payer: Medicaid Other | Attending: Hematology & Oncology

## 2017-11-15 DIAGNOSIS — C711 Malignant neoplasm of frontal lobe: Secondary | ICD-10-CM | POA: Diagnosis not present

## 2017-11-15 DIAGNOSIS — C719 Malignant neoplasm of brain, unspecified: Secondary | ICD-10-CM

## 2017-11-15 LAB — CBC WITH DIFFERENTIAL (CANCER CENTER ONLY)
BASOS ABS: 0 10*3/uL (ref 0.0–0.1)
BASOS PCT: 1 %
EOS PCT: 0 %
Eosinophils Absolute: 0 10*3/uL (ref 0.0–0.5)
HEMATOCRIT: 27.4 % — AB (ref 38.7–49.9)
Hemoglobin: 9.4 g/dL — ABNORMAL LOW (ref 13.0–17.1)
Lymphocytes Relative: 33 %
Lymphs Abs: 1.4 10*3/uL (ref 0.9–3.3)
MCH: 36.9 pg — ABNORMAL HIGH (ref 28.0–33.4)
MCHC: 34.3 g/dL (ref 32.0–35.9)
MCV: 107.5 fL — ABNORMAL HIGH (ref 82.0–98.0)
MONO ABS: 0.5 10*3/uL (ref 0.1–0.9)
MONOS PCT: 11 %
NEUTROS ABS: 2.4 10*3/uL (ref 1.5–6.5)
Neutrophils Relative %: 55 %
Platelet Count: 103 10*3/uL — ABNORMAL LOW (ref 145–400)
RBC: 2.55 MIL/uL — ABNORMAL LOW (ref 4.20–5.70)
RDW: 13.1 % (ref 11.1–15.7)
WBC Count: 4.3 10*3/uL (ref 4.0–10.0)

## 2017-11-15 LAB — CMP (CANCER CENTER ONLY)
ALBUMIN: 3.7 g/dL (ref 3.5–5.0)
ALT: 30 U/L (ref 0–55)
AST: 29 U/L (ref 5–34)
Alkaline Phosphatase: 83 U/L (ref 26–84)
Anion gap: 4 — ABNORMAL LOW (ref 5–15)
BUN: 8 mg/dL (ref 7–22)
CALCIUM: 9.4 mg/dL (ref 8.0–10.3)
CO2: 27 mmol/L (ref 18–33)
CREATININE: 1.6 mg/dL — AB (ref 0.70–1.30)
Chloride: 109 mmol/L — ABNORMAL HIGH (ref 98–108)
GLUCOSE: 86 mg/dL (ref 73–118)
Potassium: 3.2 mmol/L — ABNORMAL LOW (ref 3.3–4.7)
SODIUM: 140 mmol/L (ref 128–145)
TOTAL PROTEIN: 6.7 g/dL (ref 6.4–8.1)
Total Bilirubin: 1 mg/dL (ref 0.2–1.2)

## 2017-11-17 ENCOUNTER — Other Ambulatory Visit: Payer: Self-pay | Admitting: Family

## 2017-11-17 DIAGNOSIS — C719 Malignant neoplasm of brain, unspecified: Secondary | ICD-10-CM

## 2017-11-17 MED ORDER — ALPRAZOLAM 2 MG PO TABS: 2.0000 mg | ORAL_TABLET | Freq: Three times a day (TID) | ORAL | 0 refills | Status: DC | PRN

## 2017-11-18 ENCOUNTER — Other Ambulatory Visit: Payer: Self-pay | Admitting: *Deleted

## 2017-11-18 DIAGNOSIS — C719 Malignant neoplasm of brain, unspecified: Secondary | ICD-10-CM

## 2017-11-21 ENCOUNTER — Other Ambulatory Visit: Payer: Self-pay | Admitting: Family

## 2017-11-21 ENCOUNTER — Ambulatory Visit (HOSPITAL_BASED_OUTPATIENT_CLINIC_OR_DEPARTMENT_OTHER)
Admission: RE | Admit: 2017-11-21 | Discharge: 2017-11-21 | Disposition: A | Discharge status: Home / Self Care | Payer: Medicaid Other | Source: Ambulatory Visit | Attending: Family | Admitting: Family

## 2017-11-21 ENCOUNTER — Inpatient Hospital Stay: Payer: Medicaid Other

## 2017-11-21 DIAGNOSIS — W19XXXA Unspecified fall, initial encounter: Secondary | ICD-10-CM

## 2017-11-21 DIAGNOSIS — C719 Malignant neoplasm of brain, unspecified: Secondary | ICD-10-CM

## 2017-11-21 DIAGNOSIS — M79602 Pain in left arm: Secondary | ICD-10-CM | POA: Diagnosis present

## 2017-11-21 DIAGNOSIS — Y92009 Unspecified place in unspecified non-institutional (private) residence as the place of occurrence of the external cause: Secondary | ICD-10-CM

## 2017-11-21 DIAGNOSIS — C711 Malignant neoplasm of frontal lobe: Secondary | ICD-10-CM | POA: Diagnosis not present

## 2017-11-21 LAB — COMPREHENSIVE METABOLIC PANEL
ALT: 30 U/L (ref 10–47)
ANION GAP: 14 (ref 5–15)
AST: 36 U/L (ref 11–38)
Albumin: 3.9 g/dL (ref 3.5–5.0)
Alkaline Phosphatase: 84 U/L (ref 26–84)
BUN: 10 mg/dL (ref 7–22)
CALCIUM: 10.3 mg/dL (ref 8.0–10.3)
CHLORIDE: 106 mmol/L (ref 98–108)
CO2: 26 mmol/L (ref 18–33)
Creatinine, Ser: 1.2 mg/dL (ref 0.60–1.20)
GLUCOSE: 101 mg/dL (ref 73–118)
POTASSIUM: 4.7 mmol/L (ref 3.3–4.7)
Sodium: 146 mmol/L — ABNORMAL HIGH (ref 128–145)
Total Bilirubin: 1 mg/dL (ref 0.2–1.6)
Total Protein: 7.5 g/dL (ref 6.4–8.1)

## 2017-11-21 LAB — CBC WITH DIFFERENTIAL (CANCER CENTER ONLY)
Basophils Absolute: 0 10*3/uL (ref 0.0–0.1)
Basophils Relative: 0 %
Eosinophils Absolute: 0 10*3/uL (ref 0.0–0.5)
Eosinophils Relative: 1 %
HEMATOCRIT: 31.6 % — AB (ref 38.7–49.9)
HEMOGLOBIN: 10.9 g/dL — AB (ref 13.0–17.1)
LYMPHS ABS: 1.3 10*3/uL (ref 0.9–3.3)
LYMPHS PCT: 27 %
MCH: 36.6 pg — AB (ref 28.0–33.4)
MCHC: 34.5 g/dL (ref 32.0–35.9)
MCV: 106 fL — AB (ref 82.0–98.0)
MONOS PCT: 13 %
Monocytes Absolute: 0.6 10*3/uL (ref 0.1–0.9)
NEUTROS ABS: 2.8 10*3/uL (ref 1.5–6.5)
NEUTROS PCT: 59 %
Platelet Count: 128 10*3/uL — ABNORMAL LOW (ref 145–400)
RBC: 2.98 MIL/uL — ABNORMAL LOW (ref 4.20–5.70)
RDW: 13.3 % (ref 11.1–15.7)
WBC Count: 4.8 10*3/uL (ref 4.0–10.0)

## 2017-11-22 ENCOUNTER — Telehealth: Payer: Self-pay | Admitting: *Deleted

## 2017-11-22 NOTE — Telephone Encounter (Addendum)
Patient's father is aware of results  ----- Message from Eliezer Bottom, NP sent at 11/21/2017  7:13 PM EST ----- No fracture!!! :)   ----- Message ----- From: Interface, Rad Results In Sent: 11/21/2017   7:02 PM To: Eliezer Bottom, NP

## 2017-11-24 ENCOUNTER — Encounter: Payer: Self-pay | Admitting: *Deleted

## 2017-11-25 ENCOUNTER — Encounter: Payer: Self-pay | Admitting: *Deleted

## 2017-11-28 ENCOUNTER — Inpatient Hospital Stay: Payer: Medicaid Other

## 2017-12-02 ENCOUNTER — Other Ambulatory Visit: Payer: Self-pay | Admitting: *Deleted

## 2017-12-02 DIAGNOSIS — C719 Malignant neoplasm of brain, unspecified: Secondary | ICD-10-CM

## 2017-12-05 ENCOUNTER — Other Ambulatory Visit: Payer: Self-pay | Admitting: *Deleted

## 2017-12-05 ENCOUNTER — Inpatient Hospital Stay: Payer: Medicaid Other

## 2017-12-05 DIAGNOSIS — C711 Malignant neoplasm of frontal lobe: Secondary | ICD-10-CM | POA: Diagnosis not present

## 2017-12-05 DIAGNOSIS — C719 Malignant neoplasm of brain, unspecified: Secondary | ICD-10-CM

## 2017-12-05 LAB — CBC WITH DIFFERENTIAL (CANCER CENTER ONLY)
BASOS PCT: 0 %
Basophils Absolute: 0 10*3/uL (ref 0.0–0.1)
EOS ABS: 0.1 10*3/uL (ref 0.0–0.5)
Eosinophils Relative: 2 %
HCT: 31.1 % — ABNORMAL LOW (ref 38.7–49.9)
Hemoglobin: 10.7 g/dL — ABNORMAL LOW (ref 13.0–17.1)
LYMPHS ABS: 1.1 10*3/uL (ref 0.9–3.3)
Lymphocytes Relative: 22 %
MCH: 35.9 pg — AB (ref 28.0–33.4)
MCHC: 34.4 g/dL (ref 32.0–35.9)
MCV: 104.4 fL — ABNORMAL HIGH (ref 82.0–98.0)
MONOS PCT: 11 %
Monocytes Absolute: 0.5 10*3/uL (ref 0.1–0.9)
NEUTROS PCT: 65 %
Neutro Abs: 3.3 10*3/uL (ref 1.5–6.5)
Platelet Count: 113 10*3/uL — ABNORMAL LOW (ref 145–400)
RBC: 2.98 MIL/uL — ABNORMAL LOW (ref 4.20–5.70)
RDW: 12 % (ref 11.1–15.7)
WBC Count: 5 10*3/uL (ref 4.0–10.0)

## 2017-12-05 LAB — CMP (CANCER CENTER ONLY)
ALBUMIN: 3.6 g/dL (ref 3.5–5.0)
ALK PHOS: 74 U/L (ref 26–84)
ALT: 25 U/L (ref 10–47)
AST: 27 U/L (ref 11–38)
Anion gap: 8 (ref 5–15)
BUN: 8 mg/dL (ref 7–22)
CHLORIDE: 108 mmol/L (ref 98–108)
CO2: 27 mmol/L (ref 18–33)
CREATININE: 1.2 mg/dL (ref 0.60–1.20)
Calcium: 9.3 mg/dL (ref 8.0–10.3)
Glucose, Bld: 98 mg/dL (ref 73–118)
POTASSIUM: 3.6 mmol/L (ref 3.3–4.7)
SODIUM: 143 mmol/L (ref 128–145)
Total Bilirubin: 0.9 mg/dL (ref 0.2–1.6)
Total Protein: 7.1 g/dL (ref 6.4–8.1)

## 2017-12-05 MED ORDER — MORPHINE SULFATE ER 30 MG PO TBCR: 30.0000 mg | EXTENDED_RELEASE_TABLET | Freq: Two times a day (BID) | ORAL | 0 refills | Status: DC

## 2017-12-12 ENCOUNTER — Inpatient Hospital Stay: Payer: Medicaid Other

## 2017-12-14 ENCOUNTER — Other Ambulatory Visit: Payer: Self-pay | Admitting: *Deleted

## 2017-12-14 DIAGNOSIS — C719 Malignant neoplasm of brain, unspecified: Secondary | ICD-10-CM

## 2017-12-14 MED ORDER — OXYCODONE HCL 15 MG PO TABS: 15.0000 mg | ORAL_TABLET | ORAL | 0 refills | Status: DC | PRN

## 2017-12-19 ENCOUNTER — Encounter: Payer: Self-pay | Admitting: *Deleted

## 2017-12-19 ENCOUNTER — Inpatient Hospital Stay: Payer: Medicaid Other

## 2017-12-23 ENCOUNTER — Other Ambulatory Visit: Payer: Self-pay | Admitting: *Deleted

## 2017-12-23 DIAGNOSIS — C719 Malignant neoplasm of brain, unspecified: Secondary | ICD-10-CM

## 2017-12-26 ENCOUNTER — Inpatient Hospital Stay: Payer: Medicaid Other | Attending: Hematology & Oncology

## 2017-12-26 DIAGNOSIS — C711 Malignant neoplasm of frontal lobe: Secondary | ICD-10-CM | POA: Diagnosis present

## 2017-12-26 DIAGNOSIS — C719 Malignant neoplasm of brain, unspecified: Secondary | ICD-10-CM

## 2017-12-26 LAB — COMPREHENSIVE METABOLIC PANEL
ALT: 28 U/L (ref 10–47)
ANION GAP: 8 (ref 5–15)
AST: 27 U/L (ref 11–38)
Albumin: 3.8 g/dL (ref 3.5–5.0)
Alkaline Phosphatase: 70 U/L (ref 26–84)
BUN: 9 mg/dL (ref 7–22)
CALCIUM: 9.8 mg/dL (ref 8.0–10.3)
CO2: 29 mmol/L (ref 18–33)
Chloride: 107 mmol/L (ref 98–108)
Creatinine, Ser: 1.1 mg/dL (ref 0.60–1.20)
GLUCOSE: 100 mg/dL (ref 73–118)
POTASSIUM: 4.2 mmol/L (ref 3.3–4.7)
SODIUM: 144 mmol/L (ref 128–145)
Total Bilirubin: 0.9 mg/dL (ref 0.2–1.6)
Total Protein: 6.7 g/dL (ref 6.4–8.1)

## 2017-12-26 LAB — CBC WITH DIFFERENTIAL (CANCER CENTER ONLY)
Basophils Absolute: 0 10*3/uL (ref 0.0–0.1)
Basophils Relative: 0 %
EOS PCT: 1 %
Eosinophils Absolute: 0.1 10*3/uL (ref 0.0–0.5)
HEMATOCRIT: 34.5 % — AB (ref 38.7–49.9)
Hemoglobin: 11.5 g/dL — ABNORMAL LOW (ref 13.0–17.1)
LYMPHS PCT: 21 %
Lymphs Abs: 1.5 10*3/uL (ref 0.9–3.3)
MCH: 35 pg — ABNORMAL HIGH (ref 28.0–33.4)
MCHC: 33.3 g/dL (ref 32.0–35.9)
MCV: 104.9 fL — AB (ref 82.0–98.0)
MONO ABS: 0.7 10*3/uL (ref 0.1–0.9)
MONOS PCT: 9 %
NEUTROS ABS: 5 10*3/uL (ref 1.5–6.5)
Neutrophils Relative %: 69 %
Platelet Count: 69 10*3/uL — ABNORMAL LOW (ref 145–400)
RBC: 3.29 MIL/uL — ABNORMAL LOW (ref 4.20–5.70)
RDW: 12.3 % (ref 11.1–15.7)
WBC Count: 7.3 10*3/uL (ref 4.0–10.0)

## 2017-12-30 ENCOUNTER — Other Ambulatory Visit: Payer: Self-pay | Admitting: *Deleted

## 2017-12-30 DIAGNOSIS — C719 Malignant neoplasm of brain, unspecified: Secondary | ICD-10-CM

## 2018-01-02 ENCOUNTER — Inpatient Hospital Stay: Payer: Medicaid Other

## 2018-01-02 DIAGNOSIS — C711 Malignant neoplasm of frontal lobe: Secondary | ICD-10-CM | POA: Diagnosis not present

## 2018-01-02 DIAGNOSIS — C719 Malignant neoplasm of brain, unspecified: Secondary | ICD-10-CM

## 2018-01-02 LAB — CBC WITH DIFFERENTIAL (CANCER CENTER ONLY)
Basophils Absolute: 0 10*3/uL (ref 0.0–0.1)
Basophils Relative: 0 %
EOS ABS: 0.1 10*3/uL (ref 0.0–0.5)
EOS PCT: 1 %
HCT: 35.7 % — ABNORMAL LOW (ref 38.7–49.9)
HEMOGLOBIN: 12 g/dL — AB (ref 13.0–17.1)
Lymphocytes Relative: 23 %
Lymphs Abs: 1.7 10*3/uL (ref 0.9–3.3)
MCH: 34.9 pg — AB (ref 28.0–33.4)
MCHC: 33.6 g/dL (ref 32.0–35.9)
MCV: 103.8 fL — ABNORMAL HIGH (ref 82.0–98.0)
MONO ABS: 0.6 10*3/uL (ref 0.1–0.9)
MONOS PCT: 9 %
NEUTROS PCT: 67 %
Neutro Abs: 4.8 10*3/uL (ref 1.5–6.5)
Platelet Count: 71 10*3/uL — ABNORMAL LOW (ref 145–400)
RBC: 3.44 MIL/uL — ABNORMAL LOW (ref 4.20–5.70)
RDW: 12.3 % (ref 11.1–15.7)
WBC Count: 7.2 10*3/uL (ref 4.0–10.0)

## 2018-01-02 LAB — CMP (CANCER CENTER ONLY)
ALBUMIN: 4.1 g/dL (ref 3.5–5.0)
ALK PHOS: 67 U/L (ref 40–150)
ALT: 20 U/L (ref 0–55)
AST: 21 U/L (ref 5–34)
Anion gap: 8 (ref 3–11)
BUN: 13 mg/dL (ref 7–26)
CO2: 25 mmol/L (ref 22–29)
CREATININE: 1.2 mg/dL (ref 0.70–1.30)
Calcium: 9.8 mg/dL (ref 8.4–10.4)
Chloride: 106 mmol/L (ref 98–109)
GFR, Est AFR Am: >60 mL/min (ref >60)
GFR, Estimated: >60 mL/min (ref >60)
GLUCOSE: 95 mg/dL (ref 70–140)
Potassium: 4.7 mmol/L (ref 3.5–5.1)
SODIUM: 139 mmol/L (ref 136–145)
Total Bilirubin: 0.8 mg/dL (ref 0.2–1.2)
Total Protein: 7.1 g/dL (ref 6.4–8.3)

## 2018-01-06 ENCOUNTER — Other Ambulatory Visit: Payer: Self-pay | Admitting: *Deleted

## 2018-01-06 DIAGNOSIS — C719 Malignant neoplasm of brain, unspecified: Secondary | ICD-10-CM

## 2018-01-09 ENCOUNTER — Other Ambulatory Visit: Payer: Self-pay | Admitting: *Deleted

## 2018-01-09 ENCOUNTER — Inpatient Hospital Stay: Payer: Medicaid Other

## 2018-01-09 DIAGNOSIS — C719 Malignant neoplasm of brain, unspecified: Secondary | ICD-10-CM

## 2018-01-09 MED ORDER — ALPRAZOLAM 2 MG PO TABS: 2.0000 mg | ORAL_TABLET | Freq: Three times a day (TID) | ORAL | 0 refills | Status: DC | PRN

## 2018-01-09 MED ORDER — MORPHINE SULFATE ER 30 MG PO TBCR: 30.0000 mg | EXTENDED_RELEASE_TABLET | Freq: Two times a day (BID) | ORAL | 0 refills | Status: DC

## 2018-01-11 ENCOUNTER — Other Ambulatory Visit: Payer: Self-pay | Admitting: *Deleted

## 2018-01-11 DIAGNOSIS — C719 Malignant neoplasm of brain, unspecified: Secondary | ICD-10-CM

## 2018-01-11 MED ORDER — OXYCODONE HCL 15 MG PO TABS: 15.0000 mg | ORAL_TABLET | ORAL | 0 refills | Status: DC | PRN

## 2018-01-13 ENCOUNTER — Telehealth: Payer: Self-pay | Admitting: Hematology & Oncology

## 2018-01-13 NOTE — Telephone Encounter (Signed)
Mailed physicians/medical officers statement of patients capability to manage benefits forms to:  SOCIAL SECURITY ADMIN ATTN: T.HART [address] [address]  P: [phone]    COPY SCANNED SOCIAL SECURITY ADMINISTRATION ATTN: T. HART [address] [place] [address] SOCIAL SECURITY ADMINISTRATION ATTN: T. HART [address] [place] [id number]

## 2018-01-16 ENCOUNTER — Inpatient Hospital Stay: Payer: Medicaid Other | Attending: Hematology & Oncology

## 2018-01-16 ENCOUNTER — Inpatient Hospital Stay (HOSPITAL_BASED_OUTPATIENT_CLINIC_OR_DEPARTMENT_OTHER): Payer: Medicaid Other | Admitting: Hematology & Oncology

## 2018-01-16 ENCOUNTER — Encounter: Payer: Self-pay | Admitting: Hematology & Oncology

## 2018-01-16 VITALS — BP 105/79 | HR 108 | Temp 98.3°F | Resp 20 | Wt 166.0 lb

## 2018-01-16 DIAGNOSIS — D696 Thrombocytopenia, unspecified: Secondary | ICD-10-CM | POA: Insufficient documentation

## 2018-01-16 DIAGNOSIS — I82409 Acute embolism and thrombosis of unspecified deep veins of unspecified lower extremity: Secondary | ICD-10-CM

## 2018-01-16 DIAGNOSIS — I82412 Acute embolism and thrombosis of left femoral vein: Secondary | ICD-10-CM

## 2018-01-16 DIAGNOSIS — C711 Malignant neoplasm of frontal lobe: Secondary | ICD-10-CM | POA: Diagnosis present

## 2018-01-16 DIAGNOSIS — C719 Malignant neoplasm of brain, unspecified: Secondary | ICD-10-CM

## 2018-01-16 DIAGNOSIS — I824Z2 Acute embolism and thrombosis of unspecified deep veins of left distal lower extremity: Secondary | ICD-10-CM | POA: Diagnosis not present

## 2018-01-16 HISTORY — DX: Acute embolism and thrombosis of unspecified deep veins of unspecified lower extremity: I82.409

## 2018-01-16 LAB — CBC WITH DIFFERENTIAL (CANCER CENTER ONLY)
BASOS ABS: 0 10*3/uL (ref 0.0–0.1)
Basophils Relative: 1 %
EOS ABS: 0.1 10*3/uL (ref 0.0–0.5)
EOS PCT: 1 %
HCT: 30.5 % — ABNORMAL LOW (ref 38.7–49.9)
HEMOGLOBIN: 10.5 g/dL — AB (ref 13.0–17.1)
LYMPHS ABS: 1.1 10*3/uL (ref 0.9–3.3)
LYMPHS PCT: 25 %
MCH: 34.4 pg — AB (ref 28.0–33.4)
MCHC: 34.4 g/dL (ref 32.0–35.9)
MCV: 100 fL — AB (ref 82.0–98.0)
Monocytes Absolute: 0.5 10*3/uL (ref 0.1–0.9)
Monocytes Relative: 11 %
NEUTROS PCT: 62 %
Neutro Abs: 2.7 10*3/uL (ref 1.5–6.5)
PLATELETS: 73 10*3/uL — AB (ref 145–400)
RBC: 3.05 MIL/uL — ABNORMAL LOW (ref 4.20–5.70)
RDW: 12.2 % (ref 11.1–15.7)
WBC Count: 4.3 10*3/uL (ref 4.0–10.0)

## 2018-01-16 LAB — CMP (CANCER CENTER ONLY)
ALBUMIN: 3.9 g/dL (ref 3.5–5.0)
ALT: 17 U/L (ref 10–47)
ANION GAP: 6 (ref 5–15)
AST: 22 U/L (ref 11–38)
Alkaline Phosphatase: 66 U/L (ref 26–84)
BUN: 15 mg/dL (ref 7–22)
CO2: 28 mmol/L (ref 18–33)
Calcium: 9.7 mg/dL (ref 8.0–10.3)
Chloride: 104 mmol/L (ref 98–108)
Creatinine: 1.4 mg/dL — ABNORMAL HIGH (ref 0.60–1.20)
Glucose, Bld: 100 mg/dL (ref 73–118)
Potassium: 4.2 mmol/L (ref 3.3–4.7)
Sodium: 138 mmol/L (ref 128–145)
TOTAL PROTEIN: 7.3 g/dL (ref 6.4–8.1)
Total Bilirubin: 1.2 mg/dL (ref 0.2–1.6)

## 2018-01-16 NOTE — Progress Notes (Signed)
Hematology and Oncology Follow Up Visit  Zachary Farrell [id number] Aug 24, 1981 37 y.o. 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  He is currently off the lomustine.  He has had thrombocytopenia issues.  He has had 6 cycles.  His oncologist at [facility] feels that he should stop the lomustine.  He does not want to stop lomustine.  It has helped him.  He has been on it for about a year.  He just feels very uncomfortable stopping the lomustine.  I told him that we could restart the lomustine.  We would have to make a dosage adjustment and probably have him on 180 mg dose.  He also wants to stop going to [facility].  He does not know why he has to keep seen his oncologist at [facility].  I try to convince them that Dr. Isaac Laud has been incredibly helpful and obviously very insightful.  We will have to let them know of Zachary Farrell decision.  We can certainly do scans and monitor his blood counts here.   He does not feel good today.  He had all of his teeth taken out last week.  He did well with this.  He did not have much bleeding.  He had been off Xarelto for 3 days prior to the extractions.  He now is back on Xarelto.  He goes back to see the oral surgeon in a couple weeks.  I am not sure how long it will take to get his dentures made.    ECOG Performance Status: 2 - Symptomatic, <50% confined to bed  Medications:  Allergies as of 01/16/2018   No Known Allergies     Medication List        Accurate as of 01/16/18  4:04 PM. Always use your most recent med list.          alprazolam 2 MG tablet Commonly known as:  XANAX Take 1 tablet (2 mg total) by mouth 3 (three) times daily as needed for sleep.   Baclofen 5 MG Tabs Take 10 mg by mouth 3 (three) times daily as  needed.   dronabinol 5 MG capsule Commonly known as:  MARINOL Take 2 capsules (10 mg total) 2 (two) times daily by mouth.   lamoTRIgine 100 MG tablet Commonly known as:  LAMICTAL Start taking lamotrigine 50 mg every morning for 2 weeks, increase to 50 mg twice daily for weeks, at week 5 increase to 100 mg twice daily.   lomustine 10 MG capsule Commonly known as:  CEENU Take 2 capsules (with 2 tabs of 100mg . Total dose 220mg ) by mouth at night on day 1 of each cycle (one dose per 6 week cycle).   lomustine 100 MG capsule Commonly known as:  CEENU Take 2 capsules (with 2 tab of 10 mg. Total dose 220mg ) by mouth at night on day 1 of each cycle (one dose per 6 week cycle).   megestrol 400 MG/10ML suspension Commonly known as:  MEGACE Take 20 mLs (800 mg total) by mouth daily.   methocarbamol 500 MG tablet Commonly known as:  ROBAXIN Take by mouth 3 (three) times daily.   morphine 30 MG 12 hr tablet Commonly known as:  MS CONTIN Take  1 tablet (30 mg total) by mouth every 12 (twelve) hours.   ondansetron 8 MG tablet Commonly known as:  ZOFRAN Take 1 tablet (8 mg) by mouth 1 hour before lomustine on day 1 and every 8 hours as needed for nausea.   oxyCODONE 15 MG immediate release tablet Commonly known as:  ROXICODONE Take 1 tablet (15 mg total) by mouth every 4 (four) hours as needed.   pantoprazole 40 MG tablet Commonly known as:  PROTONIX Take 1 tablet (40 mg total) by mouth daily.   rivaroxaban 20 MG Tabs tablet Commonly known as:  XARELTO Take 1 tablet (20 mg total) by mouth daily with supper.   rizatriptan 10 MG tablet Commonly known as:  MAXALT TAKE 1 TABLET BY MOUTH AS NEEDED FOR MIGRAINE. MAY REPEAT IN 2 HOURS IF NEEDED   silver sulfADIAZINE 1 % cream Commonly known as:  SILVADENE Apply 1 application topically daily.   Suvorexant 10 MG Tabs Commonly known as:  BELSOMRA Take 10 mg by mouth at bedtime.       Allergies: No Known Allergies  Past Medical  History, Surgical history, Social history, and Family History were reviewed and updated.  Review of Systems: Review of Systems  Constitutional: Positive for malaise/fatigue.  HENT: Positive for sore throat.   Eyes: Negative.   Respiratory: Negative.   Cardiovascular: Negative.   Gastrointestinal: Negative.   Genitourinary: Negative.   Musculoskeletal: Negative.   Neurological: Negative.   Endo/Heme/Allergies: Negative.   Psychiatric/Behavioral: Negative.      Physical Exam:  weight is 166 lb (75.3 kg). His oral temperature is 98.3 F (36.8 C). His blood pressure is 105/79 and his pulse is 108 (abnormal). His respiration is 20 and oxygen saturation is 100%.   Wt Readings from Last 3 Encounters:  01/16/18 166 lb (75.3 kg)  09/26/17 176 lb (79.8 kg)  09/07/17 177 lb 6 oz (80.5 kg)    Physical Exam  Constitutional: He is oriented to person, place, and time.  HENT:  Head: Normocephalic and atraumatic.  Mouth/Throat: Oropharynx is clear and moist.  He has a well-healed craniotomy scar in the right frontoparietal region.  He has marked ecchymoses about his mouth secondary to his recent extractions of his teeth.  Eyes: Pupils are equal, round, and reactive to light. EOM are normal.  Neck: Normal range of motion.  Cardiovascular: Normal rate, regular rhythm and normal heart sounds.  Pulmonary/Chest: Effort normal and breath sounds normal.  Abdominal: Soft. Bowel sounds are normal.  Musculoskeletal: Normal range of motion. He exhibits no edema, tenderness or deformity.  Lymphadenopathy:    He has no cervical adenopathy.  Neurological: He is alert and oriented to person, place, and time.  Skin: Skin is warm and dry. No rash noted. No erythema.  Psychiatric: He has a normal mood and affect. His behavior is normal. Judgment and thought content normal.  Vitals reviewed.    Lab Results  Component Value Date   WBC 4.3 01/16/2018   HGB 11.1 (L) 09/26/2017   HCT 30.5 (L) 01/16/2018    MCV 100.0 (H) 01/16/2018   PLT 73 (L) 01/16/2018   No results found for: FERRITIN, IRON, TIBC, UIBC, IRONPCTSAT Lab Results  Component Value Date   RBC 3.05 (L) 01/16/2018   No results found for: KPAFRELGTCHN, LAMBDASER, KAPLAMBRATIO No results found for: IGGSERUM, IGA, IGMSERUM No results found for: TOTALPROTELP, ALBUMINELP, A1GS, A2GS, BETS, BETA2SER, GAMS, MSPIKE, SPEI   Chemistry      Component Value Date/Time   NA  138 01/16/2018 1426   NA 145 09/26/2017 1423   K 4.2 01/16/2018 1426   K 4.1 09/26/2017 1423   CL 104 01/16/2018 1426   CL 104 09/26/2017 1423   CO2 28 01/16/2018 1426   CO2 27 09/26/2017 1423   BUN 15 01/16/2018 1426   BUN 12 09/26/2017 1423   CREATININE 1.40 (H) 01/16/2018 1426   CREATININE 1.0 09/26/2017 1423      Component Value Date/Time   CALCIUM 9.7 01/16/2018 1426   CALCIUM 9.8 09/26/2017 1423   ALKPHOS 66 01/16/2018 1426   ALKPHOS 93 (H) 09/26/2017 1423   AST 22 01/16/2018 1426   ALT 17 01/16/2018 1426   ALT 46 09/26/2017 1423   BILITOT 1.2 01/16/2018 1426      Impression and Plan: Zachary Farrell is a 38 yo caucasian gentleman with recurrent glioblastoma. He is doing fairly well. He states that he woke up last week with swelling in his left calf. He was positive for a nonocclusive DVT in the left lower extremity and is now on a Xarelto.  We will have to recheck his leg.  In talking with he and his dad, they just do not feel that there is a need to go back out to [facility].  They very much appreciate what [facility] is done for them.  It is becoming more of a difficulty getting him out to [facility]  I told them that we would be more than happy to do what needs to be done for him with therapy and with scans.  They are pleased that we will be able to help them.  This will make his life much less complicated.  We will go ahead and get another MRI in early May.  He wants to go back onto the lomustine.  We will have to make a dosage adjustment to this if we are going  to get him back on lomustine.  He has had problems with prolonged thrombocytopenia.  I will see them back in about 5 weeks.    Volanda Napoleon, MD 4/8/20194:04 PM

## 2018-01-17 ENCOUNTER — Other Ambulatory Visit: Payer: Self-pay | Admitting: Family

## 2018-01-17 DIAGNOSIS — C719 Malignant neoplasm of brain, unspecified: Secondary | ICD-10-CM

## 2018-01-18 ENCOUNTER — Telehealth: Payer: Self-pay | Admitting: *Deleted

## 2018-01-18 NOTE — Telephone Encounter (Signed)
Message received from pt.'s father requesting letter for airlines regarding need for cancellation of flight d/t tooth removal.  Call placed back to pt.'s father to inform him that Dr. Marin Olp is not in office and to notify oral surgeon for letter.

## 2018-01-23 ENCOUNTER — Other Ambulatory Visit: Payer: Self-pay | Admitting: Family

## 2018-01-23 ENCOUNTER — Ambulatory Visit (HOSPITAL_BASED_OUTPATIENT_CLINIC_OR_DEPARTMENT_OTHER)
Admission: RE | Admit: 2018-01-23 | Discharge: 2018-01-23 | Disposition: A | Discharge status: Home / Self Care | Payer: Medicaid Other | Source: Ambulatory Visit | Attending: Hematology & Oncology | Admitting: Hematology & Oncology

## 2018-01-23 ENCOUNTER — Other Ambulatory Visit: Payer: Self-pay | Admitting: *Deleted

## 2018-01-23 ENCOUNTER — Inpatient Hospital Stay: Payer: Medicaid Other

## 2018-01-23 DIAGNOSIS — C719 Malignant neoplasm of brain, unspecified: Secondary | ICD-10-CM

## 2018-01-23 DIAGNOSIS — C711 Malignant neoplasm of frontal lobe: Secondary | ICD-10-CM | POA: Diagnosis not present

## 2018-01-23 DIAGNOSIS — I82442 Acute embolism and thrombosis of left tibial vein: Secondary | ICD-10-CM | POA: Insufficient documentation

## 2018-01-23 DIAGNOSIS — I82412 Acute embolism and thrombosis of left femoral vein: Secondary | ICD-10-CM

## 2018-01-23 DIAGNOSIS — G4701 Insomnia due to medical condition: Secondary | ICD-10-CM

## 2018-01-23 LAB — CBC WITH DIFFERENTIAL (CANCER CENTER ONLY)
Basophils Absolute: 0 10*3/uL (ref 0.0–0.1)
Basophils Relative: 0 %
EOS ABS: 0.1 10*3/uL (ref 0.0–0.5)
EOS PCT: 2 %
HCT: 31.7 % — ABNORMAL LOW (ref 38.7–49.9)
Hemoglobin: 10.9 g/dL — ABNORMAL LOW (ref 13.0–17.1)
LYMPHS ABS: 1.1 10*3/uL (ref 0.9–3.3)
LYMPHS PCT: 27 %
MCH: 34.4 pg — AB (ref 28.0–33.4)
MCHC: 34.4 g/dL (ref 32.0–35.9)
MCV: 100 fL — AB (ref 82.0–98.0)
MONO ABS: 0.3 10*3/uL (ref 0.1–0.9)
MONOS PCT: 8 %
Neutro Abs: 2.4 10*3/uL (ref 1.5–6.5)
Neutrophils Relative %: 63 %
PLATELETS: 98 10*3/uL — AB (ref 145–400)
RBC: 3.17 MIL/uL — AB (ref 4.20–5.70)
RDW: 12.5 % (ref 11.1–15.7)
WBC: 3.9 10*3/uL — AB (ref 4.0–10.0)

## 2018-01-23 LAB — CMP (CANCER CENTER ONLY)
ALBUMIN: 3.9 g/dL (ref 3.5–5.0)
ALT: 9 U/L (ref 0–55)
AST: 14 U/L (ref 5–34)
Alkaline Phosphatase: 61 U/L (ref 40–150)
Anion gap: 8 (ref 3–11)
BUN: 10 mg/dL (ref 7–26)
CHLORIDE: 106 mmol/L (ref 98–109)
CO2: 25 mmol/L (ref 22–29)
CREATININE: 1.14 mg/dL (ref 0.70–1.30)
Calcium: 9.9 mg/dL (ref 8.4–10.4)
GFR, Est AFR Am: >60 mL/min (ref >60)
GFR, Estimated: >60 mL/min (ref >60)
GLUCOSE: 97 mg/dL (ref 70–140)
POTASSIUM: 4.2 mmol/L (ref 3.5–5.1)
SODIUM: 139 mmol/L (ref 136–145)
Total Bilirubin: 0.5 mg/dL (ref 0.2–1.2)
Total Protein: 7.2 g/dL (ref 6.4–8.3)

## 2018-01-24 ENCOUNTER — Telehealth: Payer: Self-pay | Admitting: *Deleted

## 2018-01-24 ENCOUNTER — Other Ambulatory Visit: Payer: Self-pay | Admitting: Family

## 2018-01-24 DIAGNOSIS — K219 Gastro-esophageal reflux disease without esophagitis: Secondary | ICD-10-CM

## 2018-01-24 DIAGNOSIS — R066 Hiccough: Secondary | ICD-10-CM

## 2018-01-24 DIAGNOSIS — C719 Malignant neoplasm of brain, unspecified: Secondary | ICD-10-CM

## 2018-01-24 MED ORDER — MEGESTROL ACETATE 400 MG/10ML PO SUSP: 800.0000 mg | Freq: Every day | ORAL | 6 refills | Status: AC

## 2018-01-24 NOTE — Telephone Encounter (Addendum)
Patient's father is aware of results  ----- Message from Volanda Napoleon, MD sent at 01/23/2018  2:39 PM EDT ----- Call - kidney function is ok!!!  Laurey Arrow

## 2018-01-30 ENCOUNTER — Other Ambulatory Visit: Payer: Medicaid Other

## 2018-02-03 ENCOUNTER — Other Ambulatory Visit: Payer: Self-pay | Admitting: *Deleted

## 2018-02-03 DIAGNOSIS — C719 Malignant neoplasm of brain, unspecified: Secondary | ICD-10-CM

## 2018-02-06 ENCOUNTER — Other Ambulatory Visit: Payer: Self-pay | Admitting: *Deleted

## 2018-02-06 ENCOUNTER — Inpatient Hospital Stay: Payer: Medicaid Other

## 2018-02-06 DIAGNOSIS — C719 Malignant neoplasm of brain, unspecified: Secondary | ICD-10-CM

## 2018-02-06 DIAGNOSIS — C711 Malignant neoplasm of frontal lobe: Secondary | ICD-10-CM | POA: Diagnosis not present

## 2018-02-06 LAB — CMP (CANCER CENTER ONLY)
ALBUMIN: 4 g/dL (ref 3.5–5.0)
ALK PHOS: 69 U/L (ref 26–84)
ALT: 25 U/L (ref 10–47)
ANION GAP: 11 (ref 5–15)
AST: 22 U/L (ref 11–38)
BILIRUBIN TOTAL: 0.8 mg/dL (ref 0.2–1.6)
BUN: 10 mg/dL (ref 7–22)
CHLORIDE: 108 mmol/L (ref 98–108)
CO2: 27 mmol/L (ref 18–33)
CREATININE: 0.9 mg/dL (ref 0.60–1.20)
Calcium: 10.2 mg/dL (ref 8.0–10.3)
Glucose, Bld: 113 mg/dL (ref 73–118)
Potassium: 4.7 mmol/L (ref 3.3–4.7)
Sodium: 146 mmol/L — ABNORMAL HIGH (ref 128–145)
Total Protein: 7.5 g/dL (ref 6.4–8.1)

## 2018-02-06 LAB — CBC WITH DIFFERENTIAL (CANCER CENTER ONLY)
Basophils Absolute: 0 10*3/uL (ref 0.0–0.1)
Basophils Relative: 0 %
Eosinophils Absolute: 0.1 10*3/uL (ref 0.0–0.5)
Eosinophils Relative: 1 %
HEMATOCRIT: 35.1 % — AB (ref 38.7–49.9)
HEMOGLOBIN: 11.8 g/dL — AB (ref 13.0–17.1)
LYMPHS ABS: 1.6 10*3/uL (ref 0.9–3.3)
LYMPHS PCT: 32 %
MCH: 34.5 pg — AB (ref 28.0–33.4)
MCHC: 33.6 g/dL (ref 32.0–35.9)
MCV: 102.6 fL — AB (ref 82.0–98.0)
Monocytes Absolute: 0.5 10*3/uL (ref 0.1–0.9)
Monocytes Relative: 9 %
NEUTROS ABS: 2.7 10*3/uL (ref 1.5–6.5)
NEUTROS PCT: 58 %
Platelet Count: 116 10*3/uL — ABNORMAL LOW (ref 145–400)
RBC: 3.42 MIL/uL — ABNORMAL LOW (ref 4.20–5.70)
RDW: 13.5 % (ref 11.1–15.7)
WBC Count: 4.8 10*3/uL (ref 4.0–10.0)

## 2018-02-06 MED ORDER — ALPRAZOLAM 2 MG PO TABS: 2.0000 mg | ORAL_TABLET | Freq: Three times a day (TID) | ORAL | 0 refills | Status: DC | PRN

## 2018-02-06 MED ORDER — OXYCODONE HCL 15 MG PO TABS: 15.0000 mg | ORAL_TABLET | ORAL | 0 refills | Status: DC | PRN

## 2018-02-06 MED ORDER — MORPHINE SULFATE ER 30 MG PO TBCR: 30.0000 mg | EXTENDED_RELEASE_TABLET | Freq: Two times a day (BID) | ORAL | 0 refills | Status: DC

## 2018-02-10 ENCOUNTER — Other Ambulatory Visit: Payer: Self-pay | Admitting: *Deleted

## 2018-02-10 DIAGNOSIS — C719 Malignant neoplasm of brain, unspecified: Secondary | ICD-10-CM

## 2018-02-12 DIAGNOSIS — I82519 Chronic embolism and thrombosis of unspecified femoral vein: Secondary | ICD-10-CM | POA: Insufficient documentation

## 2018-02-12 DIAGNOSIS — R296 Repeated falls: Secondary | ICD-10-CM | POA: Insufficient documentation

## 2018-02-13 ENCOUNTER — Inpatient Hospital Stay: Payer: Medicaid Other | Attending: Hematology & Oncology

## 2018-02-13 DIAGNOSIS — C711 Malignant neoplasm of frontal lobe: Secondary | ICD-10-CM | POA: Insufficient documentation

## 2018-02-13 DIAGNOSIS — I82402 Acute embolism and thrombosis of unspecified deep veins of left lower extremity: Secondary | ICD-10-CM | POA: Insufficient documentation

## 2018-02-14 MED ORDER — MEGESTROL ACETATE 40 MG/ML PO SUSP
800.00 | ORAL | Status: DC
Start: 2018-02-15 — End: 2018-02-14

## 2018-02-14 MED ORDER — SODIUM CHLORIDE 0.9 % IV SOLN
10.00 | INTRAVENOUS | Status: DC
Start: ? — End: 2018-02-14

## 2018-02-14 MED ORDER — ALUM & MAG HYDROXIDE-SIMETH 200-200-20 MG/5ML PO SUSP
30.00 | ORAL | Status: DC
Start: ? — End: 2018-02-14

## 2018-02-14 MED ORDER — MORPHINE SULFATE ER 15 MG PO TBCR
15.00 | EXTENDED_RELEASE_TABLET | ORAL | Status: DC
Start: 2018-02-14 — End: 2018-02-14

## 2018-02-14 MED ORDER — PANTOPRAZOLE SODIUM 40 MG PO TBEC
40.00 | DELAYED_RELEASE_TABLET | ORAL | Status: DC
Start: 2018-02-15 — End: 2018-02-14

## 2018-02-14 MED ORDER — BACLOFEN 10 MG PO TABS
5.00 | ORAL_TABLET | ORAL | Status: DC
Start: ? — End: 2018-02-14

## 2018-02-14 MED ORDER — DRONABINOL 2.5 MG PO CAPS
10.00 | ORAL_CAPSULE | ORAL | Status: DC
Start: 2018-02-14 — End: 2018-02-14

## 2018-02-14 MED ORDER — GENERIC EXTERNAL MEDICATION
Status: DC
Start: ? — End: 2018-02-14

## 2018-02-14 MED ORDER — ACETAMINOPHEN 325 MG PO TABS
650.00 | ORAL_TABLET | ORAL | Status: DC
Start: ? — End: 2018-02-14

## 2018-02-14 MED ORDER — SUMATRIPTAN SUCCINATE 50 MG PO TABS
100.00 | ORAL_TABLET | ORAL | Status: DC
Start: ? — End: 2018-02-14

## 2018-02-14 MED ORDER — SENNA-DOCUSATE SODIUM 8.6-50 MG PO TABS
1.00 | ORAL_TABLET | ORAL | Status: DC
Start: ? — End: 2018-02-14

## 2018-02-14 MED ORDER — RIVAROXABAN 20 MG PO TABS
20.00 | ORAL_TABLET | ORAL | Status: DC
Start: 2018-02-14 — End: 2018-02-14

## 2018-02-15 ENCOUNTER — Ambulatory Visit: Payer: Medicaid Other | Admitting: Hematology & Oncology

## 2018-02-15 DIAGNOSIS — W19XXXA Unspecified fall, initial encounter: Secondary | ICD-10-CM | POA: Insufficient documentation

## 2018-02-15 DIAGNOSIS — R531 Weakness: Secondary | ICD-10-CM | POA: Insufficient documentation

## 2018-02-16 ENCOUNTER — Ambulatory Visit (HOSPITAL_COMMUNITY): Admission: RE | Admit: 2018-02-16 | Payer: Medicaid Other | Source: Ambulatory Visit

## 2018-02-16 DIAGNOSIS — R569 Unspecified convulsions: Secondary | ICD-10-CM | POA: Insufficient documentation

## 2018-02-17 ENCOUNTER — Telehealth: Payer: Self-pay | Admitting: *Deleted

## 2018-02-17 NOTE — Telephone Encounter (Signed)
Received call from patients father letting us know that Zachary Farrell will be transferred to a rehab facility from [facility]  Rehab hospital is [facility] in [facility] with [facility].  Dr. Marin Olp notified.

## 2018-02-20 ENCOUNTER — Other Ambulatory Visit: Payer: Medicaid Other

## 2018-02-20 ENCOUNTER — Ambulatory Visit: Payer: Medicaid Other | Admitting: Hematology & Oncology

## 2018-02-27 ENCOUNTER — Inpatient Hospital Stay: Payer: Medicaid Other

## 2018-02-27 ENCOUNTER — Encounter: Payer: Self-pay | Admitting: Family

## 2018-02-27 ENCOUNTER — Inpatient Hospital Stay (HOSPITAL_BASED_OUTPATIENT_CLINIC_OR_DEPARTMENT_OTHER): Payer: Medicaid Other | Admitting: Family

## 2018-02-27 ENCOUNTER — Other Ambulatory Visit: Payer: Self-pay

## 2018-02-27 VITALS — BP 118/76 | HR 72 | Temp 98.8°F | Resp 19 | Wt 166.0 lb

## 2018-02-27 DIAGNOSIS — C711 Malignant neoplasm of frontal lobe: Secondary | ICD-10-CM

## 2018-02-27 DIAGNOSIS — C719 Malignant neoplasm of brain, unspecified: Secondary | ICD-10-CM

## 2018-02-27 DIAGNOSIS — I82402 Acute embolism and thrombosis of unspecified deep veins of left lower extremity: Secondary | ICD-10-CM

## 2018-02-27 LAB — CMP (CANCER CENTER ONLY)
ALBUMIN: 3.7 g/dL (ref 3.5–5.0)
ALK PHOS: 59 U/L (ref 26–84)
ALT: 21 U/L (ref 10–47)
AST: 21 U/L (ref 11–38)
Anion gap: 8 (ref 5–15)
BUN: 7 mg/dL (ref 7–22)
CALCIUM: 9.3 mg/dL (ref 8.0–10.3)
CHLORIDE: 107 mmol/L (ref 98–108)
CO2: 27 mmol/L (ref 18–33)
CREATININE: 1.1 mg/dL (ref 0.60–1.20)
Glucose, Bld: 102 mg/dL (ref 73–118)
Potassium: 4.1 mmol/L (ref 3.3–4.7)
SODIUM: 142 mmol/L (ref 128–145)
TOTAL PROTEIN: 6.6 g/dL (ref 6.4–8.1)
Total Bilirubin: 0.8 mg/dL (ref 0.2–1.6)

## 2018-02-27 LAB — CBC WITH DIFFERENTIAL (CANCER CENTER ONLY)
BASOS ABS: 0 10*3/uL (ref 0.0–0.1)
BASOS PCT: 0 %
EOS ABS: 0 10*3/uL (ref 0.0–0.5)
Eosinophils Relative: 1 %
HCT: 32.6 % — ABNORMAL LOW (ref 38.7–49.9)
HEMOGLOBIN: 10.6 g/dL — AB (ref 13.0–17.1)
Lymphocytes Relative: 24 %
Lymphs Abs: 1.5 10*3/uL (ref 0.9–3.3)
MCH: 33.3 pg (ref 28.0–33.4)
MCHC: 32.5 g/dL (ref 32.0–35.9)
MCV: 102.5 fL — ABNORMAL HIGH (ref 82.0–98.0)
Monocytes Absolute: 0.5 10*3/uL (ref 0.1–0.9)
Monocytes Relative: 9 %
NEUTROS ABS: 4 10*3/uL (ref 1.5–6.5)
NEUTROS PCT: 66 %
Platelet Count: 150 10*3/uL (ref 145–400)
RBC: 3.18 MIL/uL — AB (ref 4.20–5.70)
RDW: 14.6 % (ref 11.1–15.7)
WBC: 6.1 10*3/uL (ref 4.0–10.0)

## 2018-02-27 MED ORDER — SODIUM CHLORIDE 0.9 % IV SOLN
10.00 | INTRAVENOUS | Status: DC
Start: ? — End: 2018-02-27

## 2018-02-27 MED ORDER — GENERIC EXTERNAL MEDICATION
Status: DC
Start: ? — End: 2018-02-27

## 2018-02-27 NOTE — Progress Notes (Signed)
Hematology and Oncology Follow Up Visit  Zachary Farrell [id number] 18-Aug-1981 37 y.o. 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 Unfortunately he was recently hospitalized after having a seizure. He is back on Lamictal now and his last seizure was last week on Friday.  He sill has episodes of dizziness and headaches off and on.  His MRI earlier this month was stable. He as pain in his neck and between his shoulder blades. Unfortunately, his home pain medication was misplaced at the hospital and he is unable to have these refilled for another week. He does not sleep well at night and will nap during the day.  No fever, chills, n/v, cough, rash, SOB, chest pain, palpitations, abdominal pain or changes in bowel or bladder habits.  He has numbness and tingling I his hands some times.  No swelling or tenderness in his extremities at this time. No episodes of bleeding, bruising or petechiae on Xarelto.  No lymphadneopathy noted on exam.  He had all of his teeth pulled and states that he has healed nicely. He goes to the dentist on June 5th to get impressions made for dentures. He is eating mashed potatoes and chili. He is staying hydrated and his weight is stable.   ECOG Performance Status: 2 - Symptomatic, <50% confined to bed  Medications:  Allergies as of 02/27/2018   No Known Allergies     Medication List        Accurate as of 02/27/18  2:04 PM. Always use your most recent med list.          alprazolam 2 MG tablet Commonly known as:  XANAX Take 1 tablet (2 mg total) by mouth 3 (three) times daily as needed for sleep.   BELSOMRA 10 MG Tabs Generic drug:  Suvorexant TAKE 1 TABLET BY MOUTH AT BEDTIME   dronabinol 5 MG capsule Commonly known as:   MARINOL Take 2 capsules (10 mg total) 2 (two) times daily by mouth.   lamoTRIgine 100 MG tablet Commonly known as:  LAMICTAL Start taking lamotrigine 50 mg every morning for 2 weeks, increase to 50 mg twice daily for weeks, at week 5 increase to 100 mg twice daily.   lomustine 10 MG capsule Commonly known as:  CEENU Take 2 capsules (with 2 tabs of 100mg . Total dose 220mg ) by mouth at night on day 1 of each cycle (one dose per 6 week cycle).   megestrol 400 MG/10ML suspension Commonly known as:  MEGACE Take 20 mLs (800 mg total) by mouth daily.   morphine 30 MG 12 hr tablet Commonly known as:  MS CONTIN Take 1 tablet (30 mg total) by mouth every 12 (twelve) hours.   oxyCODONE 15 MG immediate release tablet Commonly known as:  ROXICODONE Take 1 tablet (15 mg total) by mouth every 4 (four) hours as needed.   pantoprazole 40 MG tablet Commonly known as:  PROTONIX TAKE 1 TABLET BY MOUTH EVERY DAY   rivaroxaban 20 MG Tabs tablet Commonly known as:  XARELTO Take 1 tablet (20 mg total) by mouth daily with supper.   rizatriptan 10 MG tablet Commonly known as:  MAXALT TAKE 1 TABLET BY MOUTH AS NEEDED FOR MIGRAINE. MAY REPEAT IN 2  HOURS IF NEEDED   sennosides-docusate sodium 8.6-50 MG tablet Commonly known as:  SENOKOT-S Take by mouth.       Allergies: No Known Allergies  Past Medical History, Surgical history, Social history, and Family History were reviewed and updated.  Review of Systems: All other 10 point review of systems is negative.   Physical Exam:  weight is 166 lb (75.3 kg). His oral temperature is 98.8 F (37.1 C). His blood pressure is 118/76 and his pulse is 72. His respiration is 19 and oxygen saturation is 100%.   Wt Readings from Last 3 Encounters:  02/27/18 166 lb (75.3 kg)  01/16/18 166 lb (75.3 kg)  09/26/17 176 lb (79.8 kg)    Ocular: Sclerae unicteric, pupils equal, round and reactive to light Ear-nose-throat: Oropharynx clear, dentition  fair Lymphatic: No cervical, supraclavicular or axillary adenopathy Lungs no rales or rhonchi, good excursion bilaterally Heart regular rate and rhythm, no murmur appreciated Abd soft, nontender, positive bowel sounds, no liver or spleen tip palpated on exam, no fluid wave MSK no focal spinal tenderness, no joint edema Neuro: non-focal, well-oriented, appropriate affect Breasts: Deferred  Lab Results  Component Value Date   WBC 6.1 02/27/2018   HGB 10.6 (L) 02/27/2018   HCT 32.6 (L) 02/27/2018   MCV 102.5 (H) 02/27/2018   PLT 150 02/27/2018   No results found for: FERRITIN, IRON, TIBC, UIBC, IRONPCTSAT Lab Results  Component Value Date   RBC 3.18 (L) 02/27/2018   No results found for: KPAFRELGTCHN, LAMBDASER, KAPLAMBRATIO No results found for: IGGSERUM, IGA, IGMSERUM No results found for: Zachary Farrell, SPEI   Chemistry      Component Value Date/Time   NA 142 02/27/2018 1317   NA 145 09/26/2017 1423   K 4.1 02/27/2018 1317   K 4.1 09/26/2017 1423   CL 107 02/27/2018 1317   CL 104 09/26/2017 1423   CO2 27 02/27/2018 1317   CO2 27 09/26/2017 1423   BUN 7 02/27/2018 1317   BUN 12 09/26/2017 1423   CREATININE 1.10 02/27/2018 1317   CREATININE 1.0 09/26/2017 1423      Component Value Date/Time   CALCIUM 9.3 02/27/2018 1317   CALCIUM 9.8 09/26/2017 1423   ALKPHOS 59 02/27/2018 1317   ALKPHOS 93 (H) 09/26/2017 1423   AST 21 02/27/2018 1317   ALT 21 02/27/2018 1317   ALT 46 09/26/2017 1423   BILITOT 0.8 02/27/2018 1317      Impression and Plan: Zachary Farrell is a very pleasant 37 yo caucasian gentleman with recurrent glioblastoma multiforme of the right frontal lobe and DVT of the left leg. His Lomustine has been on hold due to lowe platelet count. His platelets today are up to 150. I will speak with Dr. Marin Olp and see about restarting his Lomustine.  Unfortunately he has had several seizures and has been hospitalized  since his last visit with [place]. He is now on Lamictal and has not had a seizure since last Friday (02/24/18).  He will continue to hold the Lomustine.  We will repeat an MRI of the brain in mid June.  We will see him back in another 5 weeks for follow-up.  They will contact our office with any questions or concerns. We can certainly see him sooner if need be.   Laverna Peace, NP 5/20/20192:04 PM

## 2018-03-07 ENCOUNTER — Inpatient Hospital Stay: Payer: Medicaid Other

## 2018-03-07 ENCOUNTER — Other Ambulatory Visit: Payer: Self-pay | Admitting: *Deleted

## 2018-03-07 DIAGNOSIS — C719 Malignant neoplasm of brain, unspecified: Secondary | ICD-10-CM

## 2018-03-07 MED ORDER — MORPHINE SULFATE ER 30 MG PO TBCR: 30.0000 mg | EXTENDED_RELEASE_TABLET | Freq: Two times a day (BID) | ORAL | 0 refills | Status: DC

## 2018-03-07 MED ORDER — OXYCODONE HCL 15 MG PO TABS: 15.0000 mg | ORAL_TABLET | ORAL | 0 refills | Status: DC | PRN

## 2018-03-07 MED ORDER — ALPRAZOLAM 2 MG PO TABS: 2.0000 mg | ORAL_TABLET | Freq: Three times a day (TID) | ORAL | 0 refills | Status: DC | PRN

## 2018-03-10 ENCOUNTER — Other Ambulatory Visit: Payer: Self-pay | Admitting: *Deleted

## 2018-03-10 DIAGNOSIS — C719 Malignant neoplasm of brain, unspecified: Secondary | ICD-10-CM

## 2018-03-10 DIAGNOSIS — G4701 Insomnia due to medical condition: Secondary | ICD-10-CM

## 2018-03-10 MED ORDER — ZOLPIDEM TARTRATE 10 MG PO TABS: 10.0000 mg | ORAL_TABLET | Freq: Every evening | ORAL | 2 refills | Status: AC | PRN

## 2018-03-13 ENCOUNTER — Inpatient Hospital Stay: Payer: Medicaid Other | Attending: Hematology & Oncology

## 2018-03-13 DIAGNOSIS — R296 Repeated falls: Secondary | ICD-10-CM | POA: Insufficient documentation

## 2018-03-13 DIAGNOSIS — I82402 Acute embolism and thrombosis of unspecified deep veins of left lower extremity: Secondary | ICD-10-CM | POA: Insufficient documentation

## 2018-03-13 DIAGNOSIS — Z7901 Long term (current) use of anticoagulants: Secondary | ICD-10-CM | POA: Insufficient documentation

## 2018-03-13 DIAGNOSIS — C711 Malignant neoplasm of frontal lobe: Secondary | ICD-10-CM | POA: Insufficient documentation

## 2018-03-20 ENCOUNTER — Inpatient Hospital Stay: Payer: Medicaid Other

## 2018-03-20 DIAGNOSIS — C711 Malignant neoplasm of frontal lobe: Secondary | ICD-10-CM | POA: Diagnosis not present

## 2018-03-20 DIAGNOSIS — R296 Repeated falls: Secondary | ICD-10-CM | POA: Diagnosis not present

## 2018-03-20 DIAGNOSIS — I82402 Acute embolism and thrombosis of unspecified deep veins of left lower extremity: Secondary | ICD-10-CM | POA: Diagnosis not present

## 2018-03-20 DIAGNOSIS — Z7901 Long term (current) use of anticoagulants: Secondary | ICD-10-CM | POA: Diagnosis not present

## 2018-03-20 DIAGNOSIS — C719 Malignant neoplasm of brain, unspecified: Secondary | ICD-10-CM

## 2018-03-20 LAB — CBC WITH DIFFERENTIAL (CANCER CENTER ONLY)
BASOS ABS: 0 10*3/uL (ref 0.0–0.1)
Basophils Relative: 0 %
Eosinophils Absolute: 0.1 10*3/uL (ref 0.0–0.5)
Eosinophils Relative: 1 %
HEMATOCRIT: 34.3 % — AB (ref 38.4–49.9)
Hemoglobin: 11.1 g/dL — ABNORMAL LOW (ref 13.0–17.1)
Lymphocytes Relative: 25 %
Lymphs Abs: 1.5 10*3/uL (ref 0.9–3.3)
MCH: 32.8 pg (ref 27.2–33.4)
MCHC: 32.4 g/dL (ref 32.0–36.0)
MCV: 101.5 fL — ABNORMAL HIGH (ref 79.3–98.0)
MONO ABS: 0.5 10*3/uL (ref 0.1–0.9)
Monocytes Relative: 9 %
NEUTROS ABS: 4 10*3/uL (ref 1.5–6.5)
Neutrophils Relative %: 65 %
PLATELETS: 139 10*3/uL — AB (ref 140–400)
RBC: 3.38 MIL/uL — AB (ref 4.20–5.82)
RDW: 15 % — AB (ref 11.0–14.6)
WBC: 6.1 10*3/uL (ref 4.0–10.3)

## 2018-03-20 LAB — CMP (CANCER CENTER ONLY)
ALBUMIN: 3.4 g/dL — AB (ref 3.5–5.0)
ALT: 29 U/L (ref 10–47)
AST: 27 U/L (ref 11–38)
Alkaline Phosphatase: 78 U/L (ref 26–84)
Anion gap: 10 (ref 5–15)
BUN: 13 mg/dL (ref 7–22)
CALCIUM: 9.7 mg/dL (ref 8.0–10.3)
CO2: 31 mmol/L (ref 18–33)
CREATININE: 1 mg/dL (ref 0.60–1.20)
Chloride: 105 mmol/L (ref 98–108)
GLUCOSE: 84 mg/dL (ref 73–118)
Potassium: 4.3 mmol/L (ref 3.3–4.7)
Sodium: 146 mmol/L — ABNORMAL HIGH (ref 128–145)
Total Bilirubin: 0.9 mg/dL (ref 0.2–1.6)
Total Protein: 7.1 g/dL (ref 6.4–8.1)

## 2018-03-24 ENCOUNTER — Other Ambulatory Visit: Payer: Self-pay | Admitting: *Deleted

## 2018-03-24 DIAGNOSIS — C719 Malignant neoplasm of brain, unspecified: Secondary | ICD-10-CM

## 2018-03-27 ENCOUNTER — Inpatient Hospital Stay: Payer: Medicaid Other

## 2018-03-29 ENCOUNTER — Ambulatory Visit (HOSPITAL_COMMUNITY): Admission: RE | Admit: 2018-03-29 | Payer: Medicaid Other | Source: Ambulatory Visit

## 2018-04-03 ENCOUNTER — Other Ambulatory Visit: Payer: Self-pay | Admitting: Hematology & Oncology

## 2018-04-03 ENCOUNTER — Other Ambulatory Visit: Payer: Self-pay | Admitting: *Deleted

## 2018-04-03 ENCOUNTER — Inpatient Hospital Stay: Payer: Medicaid Other

## 2018-04-03 ENCOUNTER — Inpatient Hospital Stay (HOSPITAL_BASED_OUTPATIENT_CLINIC_OR_DEPARTMENT_OTHER): Payer: Medicaid Other | Admitting: Hematology & Oncology

## 2018-04-03 ENCOUNTER — Other Ambulatory Visit: Payer: Self-pay

## 2018-04-03 ENCOUNTER — Encounter: Payer: Self-pay | Admitting: Hematology & Oncology

## 2018-04-03 ENCOUNTER — Ambulatory Visit (HOSPITAL_BASED_OUTPATIENT_CLINIC_OR_DEPARTMENT_OTHER)
Admission: RE | Admit: 2018-04-03 | Discharge: 2018-04-03 | Disposition: A | Discharge status: Home / Self Care | Payer: Medicaid Other | Source: Ambulatory Visit | Attending: Hematology & Oncology | Admitting: Hematology & Oncology

## 2018-04-03 VITALS — BP 105/65 | HR 91 | Temp 99.0°F | Resp 18 | Wt 164.0 lb

## 2018-04-03 DIAGNOSIS — C719 Malignant neoplasm of brain, unspecified: Secondary | ICD-10-CM

## 2018-04-03 DIAGNOSIS — I82402 Acute embolism and thrombosis of unspecified deep veins of left lower extremity: Secondary | ICD-10-CM | POA: Diagnosis not present

## 2018-04-03 DIAGNOSIS — R296 Repeated falls: Secondary | ICD-10-CM

## 2018-04-03 DIAGNOSIS — C711 Malignant neoplasm of frontal lobe: Secondary | ICD-10-CM | POA: Diagnosis not present

## 2018-04-03 LAB — CBC WITH DIFFERENTIAL (CANCER CENTER ONLY)
BASOS ABS: 0 10*3/uL (ref 0.0–0.1)
BASOS PCT: 0 %
Eosinophils Absolute: 0 10*3/uL (ref 0.0–0.5)
Eosinophils Relative: 1 %
HCT: 36.4 % — ABNORMAL LOW (ref 38.7–49.9)
HEMOGLOBIN: 12 g/dL — AB (ref 13.0–17.1)
Lymphocytes Relative: 27 %
Lymphs Abs: 1.1 10*3/uL (ref 0.9–3.3)
MCH: 32.7 pg (ref 28.0–33.4)
MCHC: 33 g/dL (ref 32.0–35.9)
MCV: 99.2 fL — ABNORMAL HIGH (ref 82.0–98.0)
Monocytes Absolute: 0.5 10*3/uL (ref 0.1–0.9)
Monocytes Relative: 13 %
NEUTROS ABS: 2.4 10*3/uL (ref 1.5–6.5)
NEUTROS PCT: 59 %
Platelet Count: 163 10*3/uL (ref 145–400)
RBC: 3.67 MIL/uL — ABNORMAL LOW (ref 4.20–5.70)
RDW: 13.3 % (ref 11.1–15.7)
WBC Count: 4 10*3/uL (ref 4.0–10.0)

## 2018-04-03 LAB — CMP (CANCER CENTER ONLY)
ALBUMIN: 3.7 g/dL (ref 3.5–5.0)
ALK PHOS: 81 U/L (ref 26–84)
ALT: 23 U/L (ref 10–47)
AST: 24 U/L (ref 11–38)
Anion gap: 10 (ref 5–15)
BUN: 11 mg/dL (ref 7–22)
CALCIUM: 9.7 mg/dL (ref 8.0–10.3)
CO2: 29 mmol/L (ref 18–33)
CREATININE: 1.1 mg/dL (ref 0.60–1.20)
Chloride: 103 mmol/L (ref 98–108)
Glucose, Bld: 98 mg/dL (ref 73–118)
Potassium: 4.2 mmol/L (ref 3.3–4.7)
Sodium: 142 mmol/L (ref 128–145)
Total Bilirubin: 1.2 mg/dL (ref 0.2–1.6)
Total Protein: 7.4 g/dL (ref 6.4–8.1)

## 2018-04-03 MED ORDER — MORPHINE SULFATE ER 30 MG PO TBCR: 30.0000 mg | EXTENDED_RELEASE_TABLET | Freq: Two times a day (BID) | ORAL | 0 refills | Status: DC

## 2018-04-03 MED ORDER — OXYCODONE HCL 15 MG PO TABS: 15.0000 mg | ORAL_TABLET | ORAL | 0 refills | Status: DC | PRN

## 2018-04-03 MED ORDER — RIVAROXABAN 10 MG PO TABS: 10.0000 mg | ORAL_TABLET | Freq: Every day | ORAL | 12 refills | Status: AC

## 2018-04-03 NOTE — Progress Notes (Signed)
Hematology and Oncology Follow Up Visit  Zachary Farrell [id number] 04-Jun-1981 37 y.o. 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  He seems to be doing fairly well.  He is having some problems with his left side.  I do not know if this was from when he fell.  Apparently, the seizures he started having was because he stopped taking his Lamictal.  I am not sure how that happened.  I think, somehow, he got the Lamictal confused with the lomustine.  He is not on lomustine right now.  We have been having him off this for the past 4 months.  He is eating okay.  He does not have teeth.  He has go back to the oral surgeon later this week for some more dental work.  Hopefully, he will get his teeth soon.  His dad is all worried that he got a note from his insurance company say that the insurance company would no longer pay for MRIs, except for twice a year.  This is not acceptable.  For a person with an active disease, we need to do surveillance scans.  I am sure that this will be taken care of.  He has had no bleeding.  He is on Xarelto.  He had the thrombus in his left leg.  We do need to recheck his left leg and see how that thrombus is responding to treatment.  He has had no nausea or vomiting.  He has had no bowel or bladder incontinence.    Currently, his performance status is ECOG 2.   Medications:  Allergies as of 04/03/2018   No Known Allergies     Medication List        Accurate as of 04/03/18  3:34 PM. Always use your most recent med list.          alprazolam 2 MG tablet Commonly known as:  XANAX Take 1 tablet (2 mg total) by mouth 3 (three) times daily as needed for sleep.   dronabinol 5 MG capsule Commonly known as:  MARINOL Take 2 capsules (10  mg total) 2 (two) times daily by mouth.   lamoTRIgine 100 MG tablet Commonly known as:  LAMICTAL Start taking lamotrigine 50 mg every morning for 2 weeks, increase to 50 mg twice daily for weeks, at week 5 increase to 100 mg twice daily.   lomustine 10 MG capsule Commonly known as:  CEENU Take 2 capsules (with 2 tabs of 100mg . Total dose 220mg ) by mouth at night on day 1 of each cycle (one dose per 6 week cycle).   megestrol 400 MG/10ML suspension Commonly known as:  MEGACE Take 20 mLs (800 mg total) by mouth daily.   morphine 30 MG 12 hr tablet Commonly known as:  MS CONTIN Take 1 tablet (30 mg total) by mouth every 12 (twelve) hours.   oxyCODONE 15 MG immediate release tablet Commonly known as:  ROXICODONE Take 1 tablet (15 mg total) by mouth every 4 (four) hours as needed.   pantoprazole 40 MG tablet Commonly known as:  PROTONIX TAKE 1 TABLET BY MOUTH EVERY DAY   rivaroxaban 20 MG Tabs tablet Commonly known as:  XARELTO Take 1 tablet (20 mg total) by  mouth daily with supper.   rizatriptan 10 MG tablet Commonly known as:  MAXALT TAKE 1 TABLET BY MOUTH AS NEEDED FOR MIGRAINE. MAY REPEAT IN 2 HOURS IF NEEDED   sennosides-docusate sodium 8.6-50 MG tablet Commonly known as:  SENOKOT-S Take by mouth.   zolpidem 10 MG tablet Commonly known as:  AMBIEN Take 1 tablet (10 mg total) by mouth at bedtime as needed for sleep.       Allergies: No Known Allergies  Past Medical History, Surgical history, Social history, and Family History were reviewed and updated.  Review of Systems: Review of Systems  Constitutional: Positive for malaise/fatigue.  HENT: Negative.   Eyes: Negative.   Respiratory: Negative.   Cardiovascular: Negative.   Gastrointestinal: Negative.   Genitourinary: Negative.   Musculoskeletal: Positive for falls and myalgias.  Skin: Negative.   Neurological: Positive for focal weakness and seizures.  Endo/Heme/Allergies: Negative.      Physical  Exam:  weight is 164 lb (74.4 kg). His oral temperature is 99 F (37.2 C). His blood pressure is 105/65 and his pulse is 91. His respiration is 18 and oxygen saturation is 98%.   Wt Readings from Last 3 Encounters:  04/03/18 164 lb (74.4 kg)  02/27/18 166 lb (75.3 kg)  01/16/18 166 lb (75.3 kg)    Physical Exam  Constitutional: He is oriented to person, place, and time.  HENT:  Head: Normocephalic and atraumatic.  Mouth/Throat: Oropharynx is clear and moist.  Eyes: Pupils are equal, round, and reactive to light. EOM are normal.  Neck: Normal range of motion.  Cardiovascular: Normal rate, regular rhythm and normal heart sounds.  Pulmonary/Chest: Effort normal and breath sounds normal.  Abdominal: Soft. Bowel sounds are normal.  Musculoskeletal: Normal range of motion. He exhibits no edema, tenderness or deformity.  He does have some tenderness in the mid thoracic spine region.  Lymphadenopathy:    He has no cervical adenopathy.  Neurological: He is alert and oriented to person, place, and time.  He does have some weakness on the left side.  This seems to be more so in the upper extremity than lower extremity.  Skin: Skin is warm and dry. No rash noted. No erythema.  Psychiatric: He has a normal mood and affect. His behavior is normal. Judgment and thought content normal.  Vitals reviewed.    Lab Results  Component Value Date   WBC 4.0 04/03/2018   HGB 12.0 (L) 04/03/2018   HCT 36.4 (L) 04/03/2018   MCV 99.2 (H) 04/03/2018   PLT 163 04/03/2018   No results found for: FERRITIN, IRON, TIBC, UIBC, IRONPCTSAT Lab Results  Component Value Date   RBC 3.67 (L) 04/03/2018   No results found for: KPAFRELGTCHN, LAMBDASER, KAPLAMBRATIO No results found for: IGGSERUM, IGA, IGMSERUM No results found for: Kathrynn Ducking, MSPIKE, SPEI   Chemistry      Component Value Date/Time   NA 146 (H) 03/20/2018 1402   NA 145 09/26/2017 1423   K 4.3  03/20/2018 1402   K 4.1 09/26/2017 1423   CL 105 03/20/2018 1402   CL 104 09/26/2017 1423   CO2 31 03/20/2018 1402   CO2 27 09/26/2017 1423   BUN 13 03/20/2018 1402   BUN 12 09/26/2017 1423   CREATININE 1.00 03/20/2018 1402   CREATININE 1.0 09/26/2017 1423      Component Value Date/Time   CALCIUM 9.7 03/20/2018 1402   CALCIUM 9.8 09/26/2017 1423   ALKPHOS 78 03/20/2018 1402  ALKPHOS 93 (H) 09/26/2017 1423   AST 27 03/20/2018 1402   ALT 29 03/20/2018 1402   ALT 46 09/26/2017 1423   BILITOT 0.9 03/20/2018 1402      Impression and Plan: Mr. Limas is a very pleasant 37 yo caucasian gentleman with recurrent glioblastoma multiforme of the right frontal lobe and DVT of the left leg.   I still think we need to hold his treatment with lomustine.  I think if he ever has to go back onto treatment, I would consider Avastin.  I do want to check a Doppler of his left leg to see how the thrombus is doing.  I also believe that he does need an MRI of the brain so we can assess the glioblastoma to see if there is any change with this.  Mr. Roback is very complex.  He has issues that do take some time to resolve.  I would like to see him back in another 4 weeks or so.     Volanda Napoleon, MD 6/24/20193:34 PM

## 2018-04-04 ENCOUNTER — Telehealth: Payer: Self-pay | Admitting: *Deleted

## 2018-04-04 NOTE — Telephone Encounter (Signed)
Pt.'s father, Gerald Stabs notified per order of Dr. Marin Olp that pt has no fracture in spine.  Pt.'s father appreciative of call and has no questions at this time.

## 2018-04-04 NOTE — Telephone Encounter (Signed)
-----   Message from Zachary Napoleon, MD sent at 04/04/2018  9:28 AM EDT ----- Call his dad -- NO fracture in the spine!!  Zachary Farrell

## 2018-04-04 NOTE — Telephone Encounter (Signed)
Call placed to patient's father to notify him that prescription for back brace is available for pick up per order of Dr. Marin Olp and that brace can be obtained at [facility] in [place].  Pt.'s father states that he will pick up script on Thursday.  Pt.'s father appreciative of call and has no questions at this time.

## 2018-04-05 ENCOUNTER — Other Ambulatory Visit: Payer: Self-pay | Admitting: *Deleted

## 2018-04-05 DIAGNOSIS — C719 Malignant neoplasm of brain, unspecified: Secondary | ICD-10-CM

## 2018-04-05 MED ORDER — ALPRAZOLAM 2 MG PO TABS: 2.0000 mg | ORAL_TABLET | Freq: Three times a day (TID) | ORAL | 2 refills | Status: DC | PRN

## 2018-04-10 ENCOUNTER — Ambulatory Visit (HOSPITAL_BASED_OUTPATIENT_CLINIC_OR_DEPARTMENT_OTHER): Payer: Medicaid Other

## 2018-04-10 ENCOUNTER — Other Ambulatory Visit: Payer: Medicaid Other

## 2018-04-14 ENCOUNTER — Ambulatory Visit (HOSPITAL_COMMUNITY): Admission: RE | Admit: 2018-04-14 | Payer: Medicaid Other | Source: Ambulatory Visit

## 2018-04-17 ENCOUNTER — Inpatient Hospital Stay: Payer: Medicaid Other

## 2018-04-24 ENCOUNTER — Other Ambulatory Visit: Payer: Self-pay | Admitting: *Deleted

## 2018-04-24 ENCOUNTER — Other Ambulatory Visit: Payer: Medicaid Other

## 2018-04-24 DIAGNOSIS — C719 Malignant neoplasm of brain, unspecified: Secondary | ICD-10-CM

## 2018-04-24 MED ORDER — LAMOTRIGINE 100 MG PO TABS: ORAL_TABLET | ORAL | 1 refills | Status: AC

## 2018-04-27 ENCOUNTER — Encounter: Payer: Self-pay | Admitting: *Deleted

## 2018-04-27 ENCOUNTER — Ambulatory Visit (HOSPITAL_COMMUNITY): Admission: RE | Admit: 2018-04-27 | Payer: Medicaid Other | Source: Ambulatory Visit

## 2018-04-27 ENCOUNTER — Telehealth: Payer: Self-pay | Admitting: *Deleted

## 2018-04-27 NOTE — Telephone Encounter (Signed)
Call received from patient's father to obtain MRI results from Dr. Marin Olp.  Informed Zachary Farrell that office will contact him when results are reviewed by Dr. Marin Olp.  Results given to Dr. Marin Olp and Dr. Marin Olp states that he will call pt.'s father with results.

## 2018-04-29 ENCOUNTER — Encounter: Payer: Self-pay | Admitting: Hematology & Oncology

## 2018-04-29 ENCOUNTER — Other Ambulatory Visit: Payer: Self-pay | Admitting: Hematology & Oncology

## 2018-04-29 DIAGNOSIS — Z7189 Other specified counseling: Secondary | ICD-10-CM

## 2018-04-29 HISTORY — DX: Other specified counseling: Z71.89

## 2018-04-29 NOTE — Progress Notes (Signed)
START ON PATHWAY REGIMEN - Neuro     A cycle is every 14 days:     Bevacizumab   **Always confirm dose/schedule in your pharmacy ordering system**  Patient Characteristics: Glioblastoma, Recurrent or Progressive, Nonsurgical Candidate, Systemic Therapy Candidate Disease Classification: Glioma Disease Classification: Glioblastoma Disease Status: Recurrent or Progressive Treatment Classification: Nonsurgical Candidate Treatment (Nonsurgical/Adjuvant): Systemic Therapy Candidate Intent of Therapy: Non-Curative / Palliative Intent, Discussed with Patient

## 2018-05-01 ENCOUNTER — Inpatient Hospital Stay: Payer: Medicaid Other | Attending: Hematology & Oncology

## 2018-05-01 ENCOUNTER — Other Ambulatory Visit: Payer: Self-pay | Admitting: *Deleted

## 2018-05-01 DIAGNOSIS — C719 Malignant neoplasm of brain, unspecified: Secondary | ICD-10-CM

## 2018-05-01 MED ORDER — OXYCODONE HCL 15 MG PO TABS: 15.0000 mg | ORAL_TABLET | ORAL | 0 refills | Status: DC | PRN

## 2018-05-01 MED ORDER — ALPRAZOLAM 2 MG PO TABS: 2.0000 mg | ORAL_TABLET | Freq: Three times a day (TID) | ORAL | 2 refills | Status: DC | PRN

## 2018-05-01 MED ORDER — MORPHINE SULFATE ER 30 MG PO TBCR: 30.0000 mg | EXTENDED_RELEASE_TABLET | Freq: Two times a day (BID) | ORAL | 0 refills | Status: DC

## 2018-05-08 ENCOUNTER — Other Ambulatory Visit: Payer: Medicaid Other

## 2018-05-08 ENCOUNTER — Ambulatory Visit: Payer: Medicaid Other | Admitting: Family

## 2018-05-10 ENCOUNTER — Telehealth: Payer: Self-pay | Admitting: *Deleted

## 2018-05-10 NOTE — Telephone Encounter (Signed)
Levada Dy RN from [facility] notifying the office that patient had a fall at home while walking. He has a small abrasion to his back that the father is treating with antibitotic ointment.  She is also wanting a verbal order for home health aide. She is unsure if insurance will approve, however she will attempt to get an aide in the home. Verbal order given.

## 2018-05-11 ENCOUNTER — Inpatient Hospital Stay: Payer: Medicaid Other | Attending: Hematology & Oncology

## 2018-05-11 ENCOUNTER — Other Ambulatory Visit: Payer: Self-pay | Admitting: *Deleted

## 2018-05-11 ENCOUNTER — Inpatient Hospital Stay (HOSPITAL_BASED_OUTPATIENT_CLINIC_OR_DEPARTMENT_OTHER): Payer: Medicaid Other | Admitting: Hematology & Oncology

## 2018-05-11 ENCOUNTER — Inpatient Hospital Stay: Payer: Medicaid Other

## 2018-05-11 ENCOUNTER — Encounter: Payer: Self-pay | Admitting: Hematology & Oncology

## 2018-05-11 ENCOUNTER — Encounter: Payer: Self-pay | Admitting: *Deleted

## 2018-05-11 ENCOUNTER — Other Ambulatory Visit: Payer: Self-pay

## 2018-05-11 ENCOUNTER — Other Ambulatory Visit: Payer: Self-pay | Admitting: Family

## 2018-05-11 VITALS — BP 107/64 | HR 97 | Temp 99.4°F | Resp 18 | Wt 170.0 lb

## 2018-05-11 DIAGNOSIS — C711 Malignant neoplasm of frontal lobe: Secondary | ICD-10-CM | POA: Diagnosis not present

## 2018-05-11 DIAGNOSIS — Z5111 Encounter for antineoplastic chemotherapy: Secondary | ICD-10-CM | POA: Diagnosis present

## 2018-05-11 DIAGNOSIS — R531 Weakness: Secondary | ICD-10-CM

## 2018-05-11 DIAGNOSIS — C719 Malignant neoplasm of brain, unspecified: Secondary | ICD-10-CM

## 2018-05-11 DIAGNOSIS — I82402 Acute embolism and thrombosis of unspecified deep veins of left lower extremity: Secondary | ICD-10-CM

## 2018-05-11 DIAGNOSIS — R51 Headache: Secondary | ICD-10-CM | POA: Diagnosis not present

## 2018-05-11 DIAGNOSIS — Z5112 Encounter for antineoplastic immunotherapy: Secondary | ICD-10-CM | POA: Insufficient documentation

## 2018-05-11 DIAGNOSIS — Z7189 Other specified counseling: Secondary | ICD-10-CM

## 2018-05-11 LAB — CMP (CANCER CENTER ONLY)
ALK PHOS: 76 U/L (ref 26–84)
ALT: 45 U/L (ref 10–47)
AST: 29 U/L (ref 11–38)
Albumin: 3.2 g/dL — ABNORMAL LOW (ref 3.5–5.0)
Anion gap: 7 (ref 5–15)
BUN: 23 mg/dL — ABNORMAL HIGH (ref 7–22)
CALCIUM: 9.1 mg/dL (ref 8.0–10.3)
CO2: 31 mmol/L (ref 18–33)
Chloride: 104 mmol/L (ref 98–108)
Creatinine: 0.9 mg/dL (ref 0.60–1.20)
Glucose, Bld: 99 mg/dL (ref 73–118)
Potassium: 3.9 mmol/L (ref 3.3–4.7)
Sodium: 142 mmol/L (ref 128–145)
Total Bilirubin: 1 mg/dL (ref 0.2–1.6)
Total Protein: 6.1 g/dL — ABNORMAL LOW (ref 6.4–8.1)

## 2018-05-11 LAB — CBC WITH DIFFERENTIAL (CANCER CENTER ONLY)
BASOS ABS: 0 10*3/uL (ref 0.0–0.1)
Basophils Relative: 0 %
EOS ABS: 0 10*3/uL (ref 0.0–0.5)
EOS PCT: 0 %
HCT: 36.9 % — ABNORMAL LOW (ref 38.7–49.9)
Hemoglobin: 11.9 g/dL — ABNORMAL LOW (ref 13.0–17.1)
LYMPHS PCT: 7 %
Lymphs Abs: 1.1 10*3/uL (ref 0.9–3.3)
MCH: 32.4 pg (ref 28.0–33.4)
MCHC: 32.2 g/dL (ref 32.0–35.9)
MCV: 100.5 fL — ABNORMAL HIGH (ref 82.0–98.0)
Monocytes Absolute: 1 10*3/uL — ABNORMAL HIGH (ref 0.1–0.9)
Monocytes Relative: 6 %
NEUTROS PCT: 87 %
Neutro Abs: 13 10*3/uL — ABNORMAL HIGH (ref 1.5–6.5)
Platelet Count: 187 10*3/uL (ref 145–400)
RBC: 3.67 MIL/uL — ABNORMAL LOW (ref 4.20–5.70)
RDW: 15.6 % (ref 11.1–15.7)
WBC: 15.1 10*3/uL — AB (ref 4.0–10.0)

## 2018-05-11 LAB — LACTATE DEHYDROGENASE: LDH: 229 U/L — ABNORMAL HIGH (ref 98–192)

## 2018-05-11 LAB — TOTAL PROTEIN, URINE DIPSTICK: PROTEIN: NEGATIVE mg/dL

## 2018-05-11 LAB — SAMPLE TO BLOOD BANK

## 2018-05-11 MED ORDER — MORPHINE SULFATE 4 MG/ML IJ SOLN
4.0000 mg | Freq: Once | INTRAMUSCULAR | Status: AC
  Administered 2018-05-11: 4 mg via INTRAVENOUS
  Filled 2018-05-11: qty 1

## 2018-05-11 MED ORDER — ATROPINE SULFATE 1 MG/ML IJ SOLN: 1.0000 mg | Freq: Once | INTRAMUSCULAR | Status: DC | PRN

## 2018-05-11 MED ORDER — LOPERAMIDE HCL 2 MG PO TABS: ORAL_TABLET | ORAL | 1 refills | Status: AC

## 2018-05-11 MED ORDER — ATROPINE SULFATE 1 MG/ML IJ SOLN
INTRAMUSCULAR | Status: AC
  Filled 2018-05-11: qty 1

## 2018-05-11 MED ORDER — DEXAMETHASONE SODIUM PHOSPHATE 10 MG/ML IJ SOLN
INTRAMUSCULAR | Status: AC
  Filled 2018-05-11: qty 1

## 2018-05-11 MED ORDER — PROCHLORPERAZINE MALEATE 10 MG PO TABS: 10.0000 mg | ORAL_TABLET | Freq: Four times a day (QID) | ORAL | 3 refills | Status: AC | PRN

## 2018-05-11 MED ORDER — DEXAMETHASONE SODIUM PHOSPHATE 10 MG/ML IJ SOLN
10.0000 mg | Freq: Once | INTRAMUSCULAR | Status: AC
  Administered 2018-05-11: 10 mg via INTRAVENOUS

## 2018-05-11 MED ORDER — MORPHINE SULFATE (PF) 4 MG/ML IV SOLN
INTRAVENOUS | Status: AC
  Filled 2018-05-11: qty 1

## 2018-05-11 MED ORDER — PALONOSETRON HCL INJECTION 0.25 MG/5ML
INTRAVENOUS | Status: AC
  Filled 2018-05-11: qty 5

## 2018-05-11 MED ORDER — DEXAMETHASONE 2 MG PO TABS: 4.0000 mg | ORAL_TABLET | Freq: Three times a day (TID) | ORAL | 3 refills | Status: AC

## 2018-05-11 MED ORDER — SODIUM CHLORIDE 0.9 % IV SOLN
Freq: Once | INTRAVENOUS | Status: AC
  Administered 2018-05-11: 09:00:00 via INTRAVENOUS
  Filled 2018-05-11: qty 250

## 2018-05-11 MED ORDER — SODIUM CHLORIDE 0.9 % IV SOLN
800.0000 mg | Freq: Once | INTRAVENOUS | Status: AC
  Administered 2018-05-11: 800 mg via INTRAVENOUS
  Filled 2018-05-11: qty 32

## 2018-05-11 MED ORDER — IRINOTECAN HCL CHEMO INJECTION 100 MG/5ML
80.0000 mg/m2 | Freq: Once | INTRAVENOUS | Status: AC
  Administered 2018-05-11: 160 mg via INTRAVENOUS
  Filled 2018-05-11: qty 8

## 2018-05-11 MED ORDER — LOPERAMIDE HCL 2 MG PO TABS: 2.0000 mg | ORAL_TABLET | Freq: Four times a day (QID) | ORAL | 1 refills | Status: DC | PRN

## 2018-05-11 MED ORDER — PALONOSETRON HCL INJECTION 0.25 MG/5ML
0.2500 mg | Freq: Once | INTRAVENOUS | Status: AC
Start: 2018-05-11 — End: 2018-05-11
  Administered 2018-05-11: 0.25 mg via INTRAVENOUS

## 2018-05-11 NOTE — Progress Notes (Signed)
Hematology and Oncology Follow Up Visit  Zachary Farrell [id number] 1981/10/03 37 y.o. 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  Unfortunately, his glioblastoma is progressing.  He had one some more seizures.  He was hospitalized.  He had an MRI that was done.  The MRI showed progression of the tumor in the right parietal lobe plus he had additional smaller tumors.  He comes in a wheelchair.  He has weakness on the left side now.  I am sure that this is from his tumor progression.  He is on a little Decadron.  He is on Lamictal for the seizures.  We need to change therapy on him.  The standard, I would think I would be Avastin with CPT-11.  I think this would be a very reasonable option for him.  He still has a decent performance status outside of the neurological issues.  He does have pain issues.  He has had more headaches.  He is on MS Contin.  He is on 30 mg every 12 hours.  I told him to increase the MS Contin to every 8 hour dosing.  May be, this will give [place] a little bit of better pain control.  He has had no bleeding.  He is on the Xarelto.  He has had no change in bowel or bladder habits.  He has had no bowel or bladder incontinence.  He has had no problems with double vision.  He has had no swallowing problems.  He is hearing okay.  Overall, his performance status is ECOG 1.    Medications:  Allergies as of 05/11/2018   No Known Allergies     Medication List        Accurate as of 05/11/18 10:12 AM. Always use your most recent med list.          alprazolam 2 MG tablet Commonly known as:  XANAX Take 1 tablet (2 mg total) by mouth 3 (three) times daily as needed for sleep.     dexamethasone 2 MG tablet Commonly known as:  DECADRON Take 4 mg by mouth 3 (three) times daily.   dronabinol 5 MG capsule Commonly known as:  MARINOL Take 2 capsules (10 mg total) 2 (two) times daily by mouth.   lamoTRIgine 100 MG tablet Commonly known as:  LAMICTAL Start taking lamotrigine 50 mg every morning for 2 weeks, increase to 50 mg twice daily for weeks, at week 5 increase to 100 mg twice daily.   lomustine 10 MG capsule Commonly known as:  CEENU Take 2 capsules (with 2 tabs of 100mg . Total dose 220mg ) by mouth at night on day 1 of each cycle (one dose per 6 week cycle).   loperamide 2 MG tablet Commonly known as:  IMODIUM A-D Take 2 at diarrhea onset , then 1 every 2hr until 12hrs with no BM. May take 2 every 4hrs at night. If diarrhea recurs repeat.   megestrol 400 MG/10ML suspension Commonly known as:  MEGACE Take 20 mLs (800 mg total) by mouth daily.   morphine 30 MG 12 hr tablet Commonly known as:  MS CONTIN Take 1 tablet (30 mg total) by mouth every 12 (twelve) hours.  oxyCODONE 15 MG immediate release tablet Commonly known as:  ROXICODONE Take 1 tablet (15 mg total) by mouth every 4 (four) hours as needed.   pantoprazole 40 MG tablet Commonly known as:  PROTONIX TAKE 1 TABLET BY MOUTH EVERY DAY   prochlorperazine 10 MG tablet Commonly known as:  COMPAZINE Take 1 tablet (10 mg total) by mouth every 6 (six) hours as needed for nausea or vomiting.   rivaroxaban 10 MG Tabs tablet Commonly known as:  XARELTO Take 1 tablet (10 mg total) by mouth daily.   rizatriptan 10 MG tablet Commonly known as:  MAXALT TAKE 1 TABLET BY MOUTH AS NEEDED FOR MIGRAINE. MAY REPEAT IN 2 HOURS IF NEEDED   sennosides-docusate sodium 8.6-50 MG tablet Commonly known as:  SENOKOT-S Take by mouth.   zolpidem 10 MG tablet Commonly known as:  AMBIEN Take 1 tablet (10 mg total) by mouth at bedtime as needed for sleep.       Allergies: No Known Allergies  Past Medical  History, Surgical history, Social history, and Family History were reviewed and updated.  Review of Systems: Review of Systems  Constitutional: Positive for malaise/fatigue.  HENT: Negative.   Eyes: Negative.   Respiratory: Negative.   Cardiovascular: Negative.   Gastrointestinal: Negative.   Genitourinary: Negative.   Musculoskeletal: Positive for falls and myalgias.  Skin: Negative.   Neurological: Positive for focal weakness and seizures.  Endo/Heme/Allergies: Negative.      Physical Exam:  weight is 170 lb (77.1 kg). His oral temperature is 99.4 F (37.4 C). His blood pressure is 107/64 and his pulse is 97. His respiration is 18 and oxygen saturation is 100%.   Wt Readings from Last 3 Encounters:  05/11/18 170 lb (77.1 kg)  04/03/18 164 lb (74.4 kg)  02/27/18 166 lb (75.3 kg)    Physical Exam  Constitutional: He is oriented to person, place, and time.  HENT:  Head: Normocephalic and atraumatic.  Mouth/Throat: Oropharynx is clear and moist.  Eyes: Pupils are equal, round, and reactive to light. EOM are normal.  Neck: Normal range of motion.  Cardiovascular: Normal rate, regular rhythm and normal heart sounds.  Pulmonary/Chest: Effort normal and breath sounds normal.  Abdominal: Soft. Bowel sounds are normal.  Musculoskeletal: Normal range of motion. He exhibits no edema, tenderness or deformity.  He does have some tenderness in the mid thoracic spine region.  Lymphadenopathy:    He has no cervical adenopathy.  Neurological: He is alert and oriented to person, place, and time.  He does have some weakness on the left side.  This seems to be more so in the upper extremity than lower extremity.  Skin: Skin is warm and dry. No rash noted. No erythema.  Psychiatric: He has a normal mood and affect. His behavior is normal. Judgment and thought content normal.  Vitals reviewed.    Lab Results  Component Value Date   WBC 15.1 (H) 05/11/2018   HGB 11.9 (L) 05/11/2018    HCT 36.9 (L) 05/11/2018   MCV 100.5 (H) 05/11/2018   PLT 187 05/11/2018   No results found for: FERRITIN, IRON, TIBC, UIBC, IRONPCTSAT Lab Results  Component Value Date   RBC 3.67 (L) 05/11/2018   No results found for: KPAFRELGTCHN, LAMBDASER, KAPLAMBRATIO No results found for: IGGSERUM, IGA, IGMSERUM No results found for: TOTALPROTELP, ALBUMINELP, A1GS, A2GS, BETS, BETA2SER, GAMS, MSPIKE, SPEI   Chemistry      Component Value Date/Time   NA 142 05/11/2018 0748   NA 145  09/26/2017 1423   K 3.9 05/11/2018 0748   K 4.1 09/26/2017 1423   CL 104 05/11/2018 0748   CL 104 09/26/2017 1423   CO2 31 05/11/2018 0748   CO2 27 09/26/2017 1423   BUN 23 (H) 05/11/2018 0748   BUN 12 09/26/2017 1423   CREATININE 0.90 05/11/2018 0748   CREATININE 1.0 09/26/2017 1423      Component Value Date/Time   CALCIUM 9.1 05/11/2018 0748   CALCIUM 9.8 09/26/2017 1423   ALKPHOS 76 05/11/2018 0748   ALKPHOS 93 (H) 09/26/2017 1423   AST 29 05/11/2018 0748   ALT 45 05/11/2018 0748   ALT 46 09/26/2017 1423   BILITOT 1.0 05/11/2018 0748      Impression and Plan: Mr. Wilbon is a very pleasant 37 yo caucasian gentleman with recurrent glioblastoma multiforme of the right frontal lobe and DVT of the left leg.   For right now, we will start him on Avastin/CPT-11.  This would be very reasonable in my opinion.  He will continue the Xarelto.  I want to check a Doppler of his lower legs.  I want to see if the thrombus is resolved.  If so, we might be able to decrease his Xarelto to 10 mg.  We will have to watch him closely.  We will plan to see him back in another 3 weeks.    I spent about 30 minutes with he and his dad.  All the time spent face-to-face with them.  I counseled him about the new chemotherapy protocol.  I went over the side effects.  I helped coordinate care for him.      Volanda Napoleon, MD 8/1/201910:12 AM

## 2018-05-11 NOTE — Patient Instructions (Addendum)
[facility] Discharge Instructions for Patients Receiving Chemotherapy  Today you received the following chemotherapy agents Camptosar (Irinotecan), Avastin  To help prevent nausea and vomiting after your treatment, we encourage you to take your nausea medication  Take Zofran (Ondansetron) by mouth twice daily beginning on day 3 (Sunday) as needed for nausea Prochlorperazine (Compazine) by mouth every 6 hours as needed for nausea.    If you develop nausea and vomiting that is not controlled by your nausea medication, call the clinic.   BELOW ARE SYMPTOMS THAT SHOULD BE REPORTED IMMEDIATELY:  *FEVER GREATER THAN 100.5 F  *CHILLS WITH OR WITHOUT FEVER  NAUSEA AND VOMITING THAT IS NOT CONTROLLED WITH YOUR NAUSEA MEDICATION  *UNUSUAL SHORTNESS OF BREATH  *UNUSUAL BRUISING OR BLEEDING  TENDERNESS IN MOUTH AND THROAT WITH OR WITHOUT PRESENCE OF ULCERS  *URINARY PROBLEMS  *BOWEL PROBLEMS  UNUSUAL RASH Items with * indicate a potential emergency and should be followed up as soon as possible.  Feel free to call the clinic should you have any questions or concerns. The clinic phone number is [phone].  Please show the [facility] at check-in to the Emergency Department and triage nurse.

## 2018-05-12 ENCOUNTER — Telehealth: Payer: Self-pay | Admitting: *Deleted

## 2018-05-12 ENCOUNTER — Other Ambulatory Visit: Payer: Self-pay | Admitting: *Deleted

## 2018-05-12 DIAGNOSIS — C719 Malignant neoplasm of brain, unspecified: Secondary | ICD-10-CM

## 2018-05-12 MED ORDER — MORPHINE SULFATE ER 30 MG PO TBCR: 30.0000 mg | EXTENDED_RELEASE_TABLET | Freq: Three times a day (TID) | ORAL | 0 refills | Status: AC

## 2018-05-12 NOTE — Addendum Note (Signed)
Addended by: Burney Gauze R on: 05/12/2018 02:22 PM   Modules accepted: Orders

## 2018-05-12 NOTE — Telephone Encounter (Signed)
Patient is doing well today after receiving chemo yesterday. He states he feels better today. He has no side effects or complaints. He has all prn medicines at home in case he needs them.   Patient and father know to call the office if he has any needs or questions.

## 2018-05-15 ENCOUNTER — Inpatient Hospital Stay: Payer: Medicaid Other

## 2018-05-22 ENCOUNTER — Encounter: Payer: Self-pay | Admitting: *Deleted

## 2018-05-22 ENCOUNTER — Other Ambulatory Visit: Payer: Medicaid Other

## 2018-05-25 ENCOUNTER — Inpatient Hospital Stay: Payer: Medicaid Other

## 2018-05-25 ENCOUNTER — Other Ambulatory Visit: Payer: Self-pay

## 2018-05-25 VITALS — BP 127/80 | HR 93 | Resp 16

## 2018-05-25 DIAGNOSIS — I82402 Acute embolism and thrombosis of unspecified deep veins of left lower extremity: Secondary | ICD-10-CM

## 2018-05-25 DIAGNOSIS — Z5112 Encounter for antineoplastic immunotherapy: Secondary | ICD-10-CM | POA: Diagnosis not present

## 2018-05-25 DIAGNOSIS — C719 Malignant neoplasm of brain, unspecified: Secondary | ICD-10-CM

## 2018-05-25 LAB — CBC WITH DIFFERENTIAL (CANCER CENTER ONLY)
Basophils Absolute: 0 10*3/uL (ref 0.0–0.1)
Basophils Relative: 0 %
EOS ABS: 0 10*3/uL (ref 0.0–0.5)
Eosinophils Relative: 0 %
HCT: 31.9 % — ABNORMAL LOW (ref 38.7–49.9)
Hemoglobin: 10.3 g/dL — ABNORMAL LOW (ref 13.0–17.1)
LYMPHS ABS: 0.6 10*3/uL — AB (ref 0.9–3.3)
LYMPHS PCT: 27 %
MCH: 32.8 pg (ref 28.0–33.4)
MCHC: 32.3 g/dL (ref 32.0–35.9)
MCV: 101.6 fL — AB (ref 82.0–98.0)
MONOS PCT: 13 %
Monocytes Absolute: 0.3 10*3/uL (ref 0.1–0.9)
NEUTROS PCT: 60 %
Neutro Abs: 1.4 10*3/uL — ABNORMAL LOW (ref 1.5–6.5)
Platelet Count: 85 10*3/uL — ABNORMAL LOW (ref 145–400)
RBC: 3.14 MIL/uL — ABNORMAL LOW (ref 4.20–5.70)
RDW: 15.9 % — ABNORMAL HIGH (ref 11.1–15.7)
WBC Count: 2.4 10*3/uL — ABNORMAL LOW (ref 4.0–10.0)

## 2018-05-25 LAB — CMP (CANCER CENTER ONLY)
ALK PHOS: 76 U/L (ref 26–84)
ALT: 35 U/L (ref 10–47)
ANION GAP: 3 — AB (ref 5–15)
AST: 22 U/L (ref 11–38)
Albumin: 3.5 g/dL (ref 3.5–5.0)
BILIRUBIN TOTAL: 0.9 mg/dL (ref 0.2–1.6)
BUN: 17 mg/dL (ref 7–22)
CHLORIDE: 100 mmol/L (ref 98–108)
CO2: 33 mmol/L (ref 18–33)
Calcium: 8.6 mg/dL (ref 8.0–10.3)
Creatinine: 0.7 mg/dL (ref 0.60–1.20)
Glucose, Bld: 125 mg/dL — ABNORMAL HIGH (ref 73–118)
POTASSIUM: 5 mmol/L — AB (ref 3.3–4.7)
SODIUM: 136 mmol/L (ref 128–145)
Total Protein: 6.1 g/dL — ABNORMAL LOW (ref 6.4–8.1)

## 2018-05-25 LAB — SAMPLE TO BLOOD BANK

## 2018-05-25 LAB — LACTATE DEHYDROGENASE: LDH: 293 U/L — ABNORMAL HIGH (ref 98–192)

## 2018-05-25 MED ORDER — SODIUM CHLORIDE 0.9 % IV SOLN
Freq: Once | INTRAVENOUS | Status: AC
Start: 2018-05-25 — End: 2018-05-25
  Administered 2018-05-25: 11:00:00 via INTRAVENOUS
  Filled 2018-05-25: qty 250

## 2018-05-25 MED ORDER — SODIUM CHLORIDE 0.9 % IV SOLN
800.0000 mg | Freq: Once | INTRAVENOUS | Status: AC
  Administered 2018-05-25: 800 mg via INTRAVENOUS
  Filled 2018-05-25: qty 32

## 2018-05-25 NOTE — Patient Instructions (Signed)
Bevacizumab injection What is this medicine? BEVACIZUMAB (be va SIZ yoo mab) is a monoclonal antibody. It is used to treat many types of cancer. This medicine may be used for other purposes; ask your health care provider or pharmacist if you have questions. COMMON BRAND NAME(S): Avastin What should I tell my health care provider before I take this medicine? They need to know if you have any of these conditions: -diabetes -heart disease -high blood pressure -history of coughing up blood -prior anthracycline chemotherapy (e.g., doxorubicin, daunorubicin, epirubicin) -recent or ongoing radiation therapy -recent or planning to have surgery -stroke -an unusual or allergic reaction to bevacizumab, hamster proteins, mouse proteins, other medicines, foods, dyes, or preservatives -pregnant or trying to get pregnant -breast-feeding How should I use this medicine? This medicine is for infusion into a vein. It is given by a health care professional in a hospital or clinic setting. Talk to your pediatrician regarding the use of this medicine in children. Special care may be needed. Overdosage: If you think you have taken too much of this medicine contact a poison control center or emergency room at once. NOTE: This medicine is only for you. Do not share this medicine with others. What if I miss a dose? It is important not to miss your dose. Call your doctor or health care professional if you are unable to keep an appointment. What may interact with this medicine? Interactions are not expected. This list may not describe all possible interactions. Give your health care provider a list of all the medicines, herbs, non-prescription drugs, or dietary supplements you use. Also tell them if you smoke, drink alcohol, or use illegal drugs. Some items may interact with your medicine. What should I watch for while using this medicine? Your condition will be monitored carefully while you are receiving this  medicine. You will need important blood work and urine testing done while you are taking this medicine. This medicine may increase your risk to bruise or bleed. Call your doctor or health care professional if you notice any unusual bleeding. This medicine should be started at least 28 days following major surgery and the site of the surgery should be totally healed. Check with your doctor before scheduling dental work or surgery while you are receiving this treatment. Talk to your doctor if you have recently had surgery or if you have a wound that has not healed. Do not become pregnant while taking this medicine or for 6 months after stopping it. Women should inform their doctor if they wish to become pregnant or think they might be pregnant. There is a potential for serious side effects to an unborn child. Talk to your health care professional or pharmacist for more information. Do not breast-feed an infant while taking this medicine and for 6 months after the last dose. This medicine has caused ovarian failure in some women. This medicine may interfere with the ability to have a child. You should talk to your doctor or health care professional if you are concerned about your fertility. What side effects may I notice from receiving this medicine? Side effects that you should report to your doctor or health care professional as soon as possible: -allergic reactions like skin rash, itching or hives, swelling of the face, lips, or tongue -chest pain or chest tightness -chills -coughing up blood -high fever -seizures -severe constipation -signs and symptoms of bleeding such as bloody or black, tarry stools; red or dark-brown urine; spitting up blood or brown material that looks   like coffee grounds; red spots on the skin; unusual bruising or bleeding from the eye, gums, or nose -signs and symptoms of a blood clot such as breathing problems; chest pain; severe, sudden headache; pain, swelling, warmth in  the leg -signs and symptoms of a stroke like changes in vision; confusion; trouble speaking or understanding; severe headaches; sudden numbness or weakness of the face, arm or leg; trouble walking; dizziness; loss of balance or coordination -stomach pain -sweating -swelling of legs or ankles -vomiting -weight gain Side effects that usually do not require medical attention (report to your doctor or health care professional if they continue or are bothersome): -back pain -changes in taste -decreased appetite -dry skin -nausea -tiredness This list may not describe all possible side effects. Call your doctor for medical advice about side effects. You may report side effects to FDA at 1-800-FDA-1088. Where should I keep my medicine? This drug is given in a hospital or clinic and will not be stored at home. NOTE: This sheet is a summary. It may not cover all possible information. If you have questions about this medicine, talk to your doctor, pharmacist, or health care provider.  2018 Elsevier/Gold Standard (2016-09-24 14:33:29)  

## 2018-05-26 ENCOUNTER — Telehealth: Payer: Self-pay | Admitting: *Deleted

## 2018-05-26 NOTE — Telephone Encounter (Signed)
Patient's father calling the office to notify us that patient has taken a "severe fall" and father states he's barely responsive.  Instructed father to call 911 and have patient assessed at the emergency room. Father wanted to know if symptoms could be related to his Avastin infusion yesterday. Explained, that no, falls and change in mental status would not be considered an expected response. He understood and will get patient to the ED.  Dr Marin Olp notified of call.

## 2018-05-29 ENCOUNTER — Other Ambulatory Visit: Payer: Medicaid Other

## 2018-05-29 ENCOUNTER — Other Ambulatory Visit: Payer: Self-pay | Admitting: *Deleted

## 2018-05-29 DIAGNOSIS — C719 Malignant neoplasm of brain, unspecified: Secondary | ICD-10-CM

## 2018-05-29 MED ORDER — OXYCODONE HCL 15 MG PO TABS: 15.0000 mg | ORAL_TABLET | ORAL | 0 refills | Status: AC | PRN

## 2018-05-29 MED ORDER — ALPRAZOLAM 2 MG PO TABS: 2.0000 mg | ORAL_TABLET | Freq: Three times a day (TID) | ORAL | 2 refills | Status: AC | PRN

## 2018-05-30 ENCOUNTER — Other Ambulatory Visit: Payer: Self-pay | Admitting: *Deleted

## 2018-05-30 DIAGNOSIS — R066 Hiccough: Secondary | ICD-10-CM

## 2018-05-30 DIAGNOSIS — K219 Gastro-esophageal reflux disease without esophagitis: Secondary | ICD-10-CM

## 2018-05-30 MED ORDER — PANTOPRAZOLE SODIUM 40 MG PO TBEC
40.0000 mg | DELAYED_RELEASE_TABLET | Freq: Two times a day (BID) | ORAL | 3 refills | Status: AC
Start: 2018-05-30 — End: ?

## 2018-05-30 MED ORDER — PANTOPRAZOLE SODIUM 40 MG PO TBEC
40.0000 mg | DELAYED_RELEASE_TABLET | Freq: Two times a day (BID) | ORAL | 3 refills | Status: DC
Start: 2018-05-30 — End: 2018-05-30

## 2018-06-05 ENCOUNTER — Other Ambulatory Visit: Payer: Medicaid Other

## 2018-06-05 ENCOUNTER — Other Ambulatory Visit: Payer: Self-pay | Admitting: *Deleted

## 2018-06-05 DIAGNOSIS — C719 Malignant neoplasm of brain, unspecified: Secondary | ICD-10-CM

## 2018-06-06 ENCOUNTER — Other Ambulatory Visit: Payer: Self-pay | Admitting: *Deleted

## 2018-06-06 ENCOUNTER — Encounter: Payer: Self-pay | Admitting: Hematology & Oncology

## 2018-06-08 ENCOUNTER — Inpatient Hospital Stay: Payer: Medicaid Other

## 2018-06-08 ENCOUNTER — Inpatient Hospital Stay (HOSPITAL_BASED_OUTPATIENT_CLINIC_OR_DEPARTMENT_OTHER): Payer: Medicaid Other | Admitting: Hematology & Oncology

## 2018-06-08 ENCOUNTER — Encounter: Payer: Self-pay | Admitting: Hematology & Oncology

## 2018-06-08 ENCOUNTER — Other Ambulatory Visit: Payer: Self-pay | Admitting: *Deleted

## 2018-06-08 ENCOUNTER — Other Ambulatory Visit: Payer: Self-pay

## 2018-06-08 VITALS — BP 125/89 | HR 103 | Temp 98.0°F | Resp 17 | Wt 168.0 lb

## 2018-06-08 DIAGNOSIS — C719 Malignant neoplasm of brain, unspecified: Secondary | ICD-10-CM

## 2018-06-08 DIAGNOSIS — I82402 Acute embolism and thrombosis of unspecified deep veins of left lower extremity: Secondary | ICD-10-CM

## 2018-06-08 DIAGNOSIS — C711 Malignant neoplasm of frontal lobe: Secondary | ICD-10-CM

## 2018-06-08 DIAGNOSIS — Z5112 Encounter for antineoplastic immunotherapy: Secondary | ICD-10-CM | POA: Diagnosis not present

## 2018-06-08 LAB — CBC WITH DIFFERENTIAL/PLATELET
Basophils Absolute: 0 10*3/uL (ref 0.0–0.1)
Basophils Relative: 0 %
EOS ABS: 0 10*3/uL (ref 0.0–0.7)
Eosinophils Relative: 1 %
HCT: 42.1 % (ref 39.0–52.0)
HEMOGLOBIN: 14 g/dL (ref 13.0–17.0)
LYMPHS ABS: 0.6 10*3/uL — AB (ref 0.7–4.0)
Lymphocytes Relative: 18 %
MCH: 32.6 pg (ref 26.0–34.0)
MCHC: 33.3 g/dL (ref 30.0–36.0)
MCV: 97.9 fL (ref 78.0–100.0)
MONO ABS: 0.4 10*3/uL (ref 0.1–1.0)
MONOS PCT: 11 %
NEUTROS PCT: 70 %
Neutro Abs: 2.4 10*3/uL (ref 1.7–7.7)
Platelets: 63 10*3/uL — ABNORMAL LOW (ref 145–400)
RBC: 4.3 MIL/uL (ref 4.22–5.81)
RDW: 17.1 % — ABNORMAL HIGH (ref 11.5–15.5)
WBC: 3.5 10*3/uL — ABNORMAL LOW (ref 4.0–10.5)

## 2018-06-08 LAB — CMP (CANCER CENTER ONLY)
ALK PHOS: 76 U/L (ref 26–84)
ALT: 40 U/L (ref 10–47)
ANION GAP: 8 (ref 5–15)
AST: 35 U/L (ref 11–38)
Albumin: 3.1 g/dL — ABNORMAL LOW (ref 3.5–5.0)
BILIRUBIN TOTAL: 0.5 mg/dL (ref 0.2–1.6)
BUN: 10 mg/dL (ref 7–22)
CALCIUM: 9 mg/dL (ref 8.0–10.3)
CO2: 32 mmol/L (ref 18–33)
Chloride: 101 mmol/L (ref 98–108)
Creatinine: 0.9 mg/dL (ref 0.60–1.20)
Glucose, Bld: 79 mg/dL (ref 73–118)
POTASSIUM: 3.6 mmol/L (ref 3.3–4.7)
Sodium: 141 mmol/L (ref 128–145)
TOTAL PROTEIN: 6.4 g/dL (ref 6.4–8.1)

## 2018-06-08 LAB — LACTATE DEHYDROGENASE: LDH: 421 U/L — AB (ref 98–192)

## 2018-06-08 LAB — SAMPLE TO BLOOD BANK

## 2018-06-08 LAB — SAVE SMEAR

## 2018-06-08 MED ORDER — SODIUM CHLORIDE 0.9 % IV SOLN
10.6000 mg/kg | Freq: Once | INTRAVENOUS | Status: AC
  Administered 2018-06-08: 800 mg via INTRAVENOUS
  Filled 2018-06-08: qty 32

## 2018-06-08 MED ORDER — SODIUM CHLORIDE 0.9 % IV SOLN
Freq: Once | INTRAVENOUS | Status: AC
  Administered 2018-06-08: 11:00:00 via INTRAVENOUS
  Filled 2018-06-08: qty 250

## 2018-06-08 MED ORDER — TRIAMTERENE-HCTZ 37.5-25 MG PO TABS: 1.0000 | ORAL_TABLET | Freq: Every day | ORAL | 1 refills | Status: AC

## 2018-06-08 MED ORDER — LIDOCAINE-PRILOCAINE 2.5-2.5 % EX CREA: 1.0000 "application " | TOPICAL_CREAM | CUTANEOUS | 2 refills | Status: AC | PRN

## 2018-06-08 MED ORDER — FUROSEMIDE 10 MG/ML IJ SOLN
INTRAMUSCULAR | Status: AC
  Filled 2018-06-08: qty 4

## 2018-06-08 MED ORDER — FUROSEMIDE 10 MG/ML IJ SOLN
40.0000 mg | Freq: Once | INTRAMUSCULAR | Status: AC
  Administered 2018-06-08: 40 mg via INTRAVENOUS

## 2018-06-08 MED ORDER — BACLOFEN 20 MG PO TABS: 20.0000 mg | ORAL_TABLET | Freq: Three times a day (TID) | ORAL | 0 refills | Status: AC

## 2018-06-08 NOTE — Patient Instructions (Signed)
Bevacizumab injection What is this medicine? BEVACIZUMAB (be va SIZ yoo mab) is a monoclonal antibody. It is used to treat many types of cancer. This medicine may be used for other purposes; ask your health care provider or pharmacist if you have questions. COMMON BRAND NAME(S): Avastin What should I tell my health care provider before I take this medicine? They need to know if you have any of these conditions: -diabetes -heart disease -high blood pressure -history of coughing up blood -prior anthracycline chemotherapy (e.g., doxorubicin, daunorubicin, epirubicin) -recent or ongoing radiation therapy -recent or planning to have surgery -stroke -an unusual or allergic reaction to bevacizumab, hamster proteins, mouse proteins, other medicines, foods, dyes, or preservatives -pregnant or trying to get pregnant -breast-feeding How should I use this medicine? This medicine is for infusion into a vein. It is given by a health care professional in a hospital or clinic setting. Talk to your pediatrician regarding the use of this medicine in children. Special care may be needed. Overdosage: If you think you have taken too much of this medicine contact a poison control center or emergency room at once. NOTE: This medicine is only for you. Do not share this medicine with others. What if I miss a dose? It is important not to miss your dose. Call your doctor or health care professional if you are unable to keep an appointment. What may interact with this medicine? Interactions are not expected. This list may not describe all possible interactions. Give your health care provider a list of all the medicines, herbs, non-prescription drugs, or dietary supplements you use. Also tell them if you smoke, drink alcohol, or use illegal drugs. Some items may interact with your medicine. What should I watch for while using this medicine? Your condition will be monitored carefully while you are receiving this  medicine. You will need important blood work and urine testing done while you are taking this medicine. This medicine may increase your risk to bruise or bleed. Call your doctor or health care professional if you notice any unusual bleeding. This medicine should be started at least 28 days following major surgery and the site of the surgery should be totally healed. Check with your doctor before scheduling dental work or surgery while you are receiving this treatment. Talk to your doctor if you have recently had surgery or if you have a wound that has not healed. Do not become pregnant while taking this medicine or for 6 months after stopping it. Women should inform their doctor if they wish to become pregnant or think they might be pregnant. There is a potential for serious side effects to an unborn child. Talk to your health care professional or pharmacist for more information. Do not breast-feed an infant while taking this medicine and for 6 months after the last dose. This medicine has caused ovarian failure in some women. This medicine may interfere with the ability to have a child. You should talk to your doctor or health care professional if you are concerned about your fertility. What side effects may I notice from receiving this medicine? Side effects that you should report to your doctor or health care professional as soon as possible: -allergic reactions like skin rash, itching or hives, swelling of the face, lips, or tongue -chest pain or chest tightness -chills -coughing up blood -high fever -seizures -severe constipation -signs and symptoms of bleeding such as bloody or black, tarry stools; red or dark-brown urine; spitting up blood or brown material that looks   like coffee grounds; red spots on the skin; unusual bruising or bleeding from the eye, gums, or nose -signs and symptoms of a blood clot such as breathing problems; chest pain; severe, sudden headache; pain, swelling, warmth in  the leg -signs and symptoms of a stroke like changes in vision; confusion; trouble speaking or understanding; severe headaches; sudden numbness or weakness of the face, arm or leg; trouble walking; dizziness; loss of balance or coordination -stomach pain -sweating -swelling of legs or ankles -vomiting -weight gain Side effects that usually do not require medical attention (report to your doctor or health care professional if they continue or are bothersome): -back pain -changes in taste -decreased appetite -dry skin -nausea -tiredness This list may not describe all possible side effects. Call your doctor for medical advice about side effects. You may report side effects to FDA at 1-800-FDA-1088. Where should I keep my medicine? This drug is given in a hospital or clinic and will not be stored at home. NOTE: This sheet is a summary. It may not cover all possible information. If you have questions about this medicine, talk to your doctor, pharmacist, or health care provider.  2018 Elsevier/Gold Standard (2016-09-24 14:33:29)  

## 2018-06-08 NOTE — Progress Notes (Addendum)
Hematology and Oncology Follow Up Visit  Zachary Farrell [id number] 1981/01/04 37 y.o. 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  Unfortunately, he still is not doing all that well.  He has been to the emergency room a couple times.  He just is not doing all that well with the protocol.  As such, I am going to stop the CPT-11.  I think this might be causing more problems than benefit.  The real change is that he might be moving up to [place], [place].  He apparently has a girlfriend up there.  I certainly understand why he would want to go up there.  I think the problem might be whether or not his parole officer will let him move up there.  If he does go up to [place] we will see if he can be seen at [facility].  He is doing okay with pain.  He has not much movement on the left side.  He seems to be eating okay.  He apparently has had esophageal spasms.  We will try some baclofen on him.  I will give him 20 mg to take 3 times a day as needed.  He has had no issues with bowel or bladder incontinence.  He is doing okay with the Xarelto.  I really need to set him up with another Doppler to see how the blood clot looks.  He has had no seizure activity.    Overall, his performance status is ECOG 2.    Medications:  Allergies as of 06/08/2018   No Known Allergies     Medication List        Accurate as of 06/08/18 11:28 AM. Always use your most recent med list.          alprazolam 2 MG tablet Commonly known as:  XANAX Take 1 tablet (2 mg total) by mouth 3 (three) times daily as needed for sleep.   baclofen 20 MG tablet Commonly known  as:  LIORESAL Take 1 tablet (20 mg total) by mouth 3 (three) times daily.   dexamethasone 2 MG tablet Commonly known as:  DECADRON Take 2 tablets (4 mg total) by mouth 3 (three) times daily.   dronabinol 5 MG capsule Commonly known as:  MARINOL Take 2 capsules (10 mg total) 2 (two) times daily by mouth.   lamoTRIgine 100 MG tablet Commonly known as:  LAMICTAL Start taking lamotrigine 50 mg every morning for 2 weeks, increase to 50 mg twice daily for weeks, at week 5 increase to 100 mg twice daily.   lidocaine-prilocaine cream Commonly known as:  EMLA Apply 1 application topically as needed.   lomustine 10 MG capsule Commonly known as:  CEENU Take 2 capsules (with 2 tabs of 100mg . Total dose 220mg ) by mouth at night on day 1 of each cycle (one dose per 6 week cycle).   loperamide 2 MG tablet Commonly known as:  IMODIUM A-D Take 2 at diarrhea onset , then 1 every 2hr until 12hrs with no BM. May take 2 every 4hrs at night. If diarrhea recurs repeat.   megestrol 400 MG/10ML suspension Commonly known as:  MEGACE Take 20 mLs (800 mg total) by mouth daily.   morphine 30 MG 12 hr tablet Commonly known as:  MS CONTIN Take 1 tablet (30 mg total) by mouth every 8 (eight) hours.   oxyCODONE 15 MG immediate release tablet Commonly known as:  ROXICODONE Take 1 tablet (15 mg total) by mouth every 4 (four) hours as needed.   pantoprazole 40 MG tablet Commonly known as:  PROTONIX Take 1 tablet (40 mg total) by mouth 2 (two) times daily.   prochlorperazine 10 MG tablet Commonly known as:  COMPAZINE Take 1 tablet (10 mg total) by mouth every 6 (six) hours as needed for nausea or vomiting.   rivaroxaban 10 MG Tabs tablet Commonly known as:  XARELTO Take 1 tablet (10 mg total) by mouth daily.   rizatriptan 10 MG tablet Commonly known as:  MAXALT TAKE 1 TABLET BY MOUTH AS NEEDED FOR MIGRAINE. MAY REPEAT IN 2 HOURS IF NEEDED   sennosides-docusate sodium 8.6-50 MG tablet Commonly  known as:  SENOKOT-S Take by mouth.   triamterene-hydrochlorothiazide 37.5-25 MG tablet Commonly known as:  MAXZIDE-25 Take 1 tablet by mouth daily.   zolpidem 10 MG tablet Commonly known as:  AMBIEN Take 1 tablet (10 mg total) by mouth at bedtime as needed for sleep.       Allergies: No Known Allergies  Past Medical History, Surgical history, Social history, and Family History were reviewed and updated.  Review of Systems: Review of Systems  Constitutional: Positive for malaise/fatigue.  HENT: Negative.   Eyes: Negative.   Respiratory: Negative.   Cardiovascular: Negative.   Gastrointestinal: Negative.   Genitourinary: Negative.   Musculoskeletal: Positive for falls and myalgias.  Skin: Negative.   Neurological: Positive for focal weakness and seizures.  Endo/Heme/Allergies: Negative.      Physical Exam:  weight is 168 lb (76.2 kg). His temperature is 98 F (36.7 C). His blood pressure is 125/89 and his pulse is 103 (abnormal). His respiration is 17 and oxygen saturation is 99%.   Wt Readings from Last 3 Encounters:  06/08/18 168 lb (76.2 kg)  06/08/18 168 lb (76.2 kg)  05/11/18 170 lb (77.1 kg)    Physical Exam  Constitutional: He is oriented to person, place, and time.  HENT:  Head: Normocephalic and atraumatic.  Mouth/Throat: Oropharynx is clear and moist.  Eyes: Pupils are equal, round, and reactive to light. EOM are normal.  Neck: Normal range of motion.  Cardiovascular: Normal rate, regular rhythm and normal heart sounds.  Pulmonary/Chest: Effort normal and breath sounds normal.  Abdominal: Soft. Bowel sounds are normal.  Musculoskeletal: Normal range of motion. He exhibits no edema, tenderness or deformity.  He does have some tenderness in the mid thoracic spine region.  Lymphadenopathy:    He has no cervical adenopathy.  Neurological: He is alert and oriented to person, place, and time.  He does have some weakness on the left side.  This seems to be  more so in the upper extremity than lower extremity.  Skin: Skin is warm and dry. No rash noted. No erythema.  Psychiatric: He has a normal mood and affect. His behavior is normal. Judgment and thought content normal.  Vitals reviewed.    Lab Results  Component Value Date   WBC 3.5 (L) 06/08/2018   HGB 14.0 06/08/2018   HCT 42.1 06/08/2018   MCV 97.9 06/08/2018   PLT 63 (L) 06/08/2018   No results found for: FERRITIN, IRON, TIBC, UIBC, IRONPCTSAT Lab Results  Component  Value Date   RBC 4.30 06/08/2018   No results found for: KPAFRELGTCHN, LAMBDASER, KAPLAMBRATIO No results found for: IGGSERUM, IGA, IGMSERUM No results found for: Odetta Pink, SPEI   Chemistry      Component Value Date/Time   NA 141 06/08/2018 0835   NA 145 09/26/2017 1423   K 3.6 06/08/2018 0835   K 4.1 09/26/2017 1423   CL 101 06/08/2018 0835   CL 104 09/26/2017 1423   CO2 32 06/08/2018 0835   CO2 27 09/26/2017 1423   BUN 10 06/08/2018 0835   BUN 12 09/26/2017 1423   CREATININE 0.90 06/08/2018 0835   CREATININE 1.0 09/26/2017 1423      Component Value Date/Time   CALCIUM 9.0 06/08/2018 0835   CALCIUM 9.8 09/26/2017 1423   ALKPHOS 76 06/08/2018 0835   ALKPHOS 93 (H) 09/26/2017 1423   AST 35 06/08/2018 0835   ALT 40 06/08/2018 0835   ALT 46 09/26/2017 1423   BILITOT 0.5 06/08/2018 0835      Impression and Plan: Mr. Fuston is a very pleasant 37 yo caucasian gentleman with recurrent glioblastoma multiforme of the right frontal lobe and DVT of the left leg.   I would just continue him on the Avastin for right now.  I am not sure when he would be going up to [place], [place].  I do want to repeat the Doppler of his left leg.  I want to see how the blood clot looks.   We will plan to get him back to see [place] in another 3 weeks.    Volanda Napoleon, MD 8/29/201911:28 AM

## 2018-06-14 ENCOUNTER — Telehealth: Payer: Self-pay | Admitting: *Deleted

## 2018-06-14 NOTE — Telephone Encounter (Signed)
Patient's father is requesting hospice to be ordered for [facility]. He has no preference to which company provides services.  Reviewed with Dr Marin Olp and okay is given for [facility].   [place] and spoke to [facility]. Referral made. They will call patient's father, Gerald Stabs, later today.

## 2018-06-15 ENCOUNTER — Telehealth: Payer: Self-pay | Admitting: *Deleted

## 2018-06-15 NOTE — Telephone Encounter (Signed)
Message received from patient's father stating that pt is now non-verbal with eyes closed and is concerned about a 5-6 inch circumference "squishy" area at the patient's resection site on his scalp.  Dr. Marin Olp notified and suggests for [facility] to contact hospice.  Call placed back to [facility], pt.'s father and notified him per order of Dr. Marin Olp to contact hospice.  Gerald Stabs states that hospice will be out to see [place] early tomorrow morning and he will wait on them to assess patient.  Gerald Stabs was very appreciative of call back and has no other questions at this time.

## 2018-06-16 ENCOUNTER — Telehealth: Payer: Self-pay | Admitting: *Deleted

## 2018-06-16 NOTE — Telephone Encounter (Signed)
Call received from [facility] with [place] to inform Dr. Marin Olp that she visited with pt this morning and that pt will be transferred to the [facility] on [address] in [place] today.  Dr. Marin Olp notified.

## 2018-06-19 ENCOUNTER — Telehealth: Payer: Self-pay | Admitting: *Deleted

## 2018-06-19 NOTE — Telephone Encounter (Signed)
Patient's father notifying the office that patient passed away yesterday, Jul 04, 2023, at [facility]  Dr Marin Olp notified.

## 2018-06-22 ENCOUNTER — Other Ambulatory Visit (HOSPITAL_BASED_OUTPATIENT_CLINIC_OR_DEPARTMENT_OTHER): Payer: Medicaid Other

## 2018-06-22 ENCOUNTER — Other Ambulatory Visit: Payer: Medicaid Other

## 2018-06-22 ENCOUNTER — Ambulatory Visit: Payer: Medicaid Other

## 2018-06-27 ENCOUNTER — Other Ambulatory Visit (HOSPITAL_BASED_OUTPATIENT_CLINIC_OR_DEPARTMENT_OTHER): Payer: Medicaid Other

## 2018-06-29 ENCOUNTER — Other Ambulatory Visit: Payer: Medicaid Other

## 2018-06-29 ENCOUNTER — Ambulatory Visit: Payer: Medicaid Other

## 2018-06-29 ENCOUNTER — Ambulatory Visit: Payer: Medicaid Other | Admitting: Hematology & Oncology

## 2018-07-06 ENCOUNTER — Other Ambulatory Visit: Payer: Medicaid Other

## 2018-07-06 ENCOUNTER — Ambulatory Visit: Payer: Medicaid Other

## 2018-07-11 DEATH — deceased

## 2018-07-20 ENCOUNTER — Ambulatory Visit: Payer: Medicaid Other | Admitting: Hematology & Oncology

## 2018-07-20 ENCOUNTER — Other Ambulatory Visit: Payer: Medicaid Other

## 2018-07-20 ENCOUNTER — Ambulatory Visit: Payer: Medicaid Other

## 2018-07-21 IMAGING — CR DG HUMERUS 2V *L*
4 series · 4 of 4 positions shown · non-contrast
Comparison: None.

CLINICAL DATA: Left upper extremity pain after fall last week

EXAM:
LEFT HUMERUS - 2+ VIEW

[w humerus ap left * (1 of 3)]
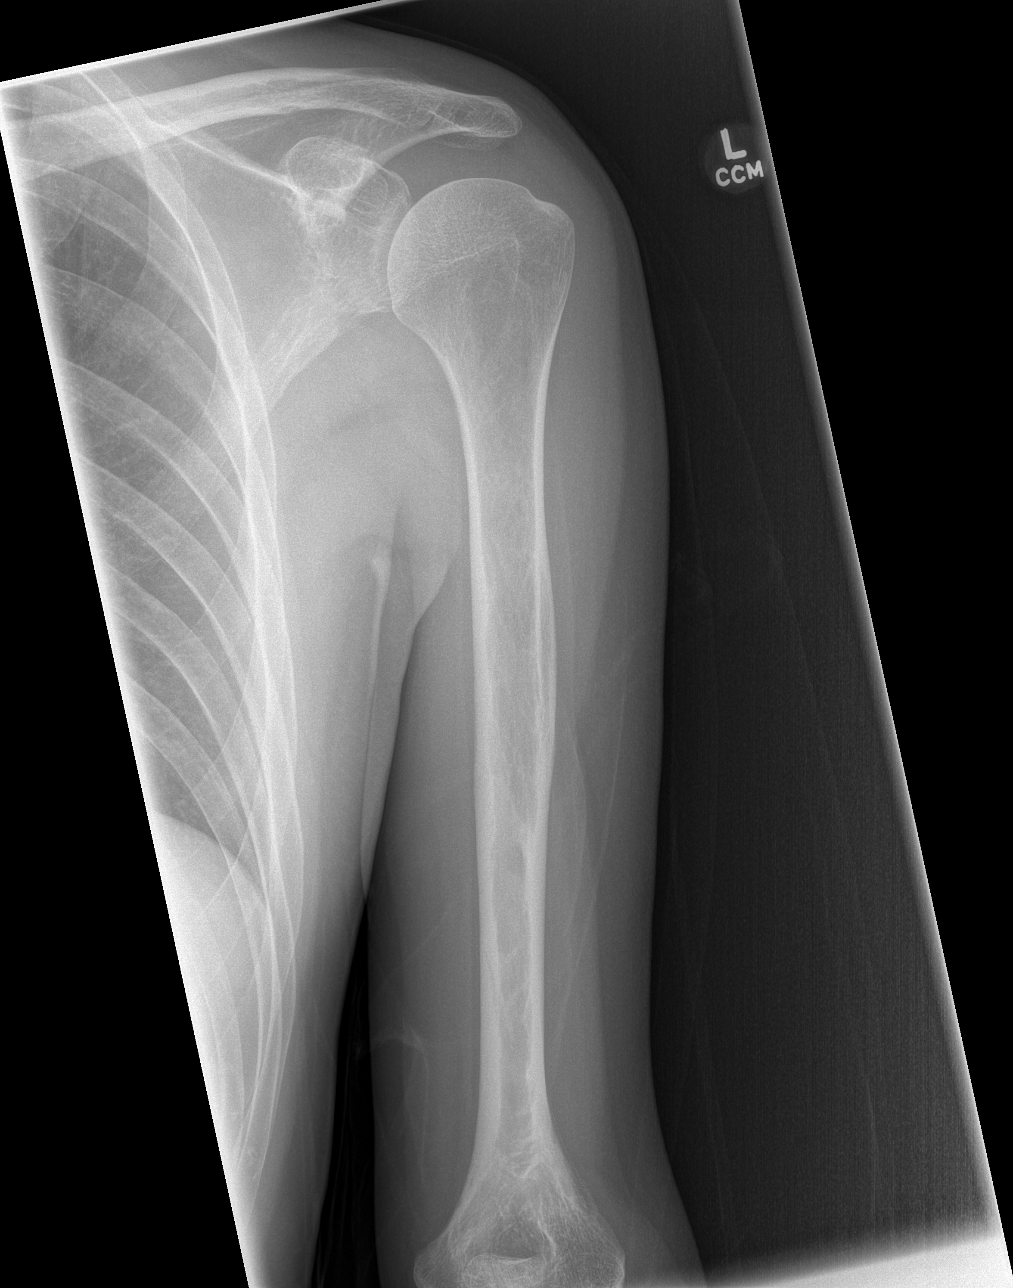

[w humerus ap left * (2 of 3)]
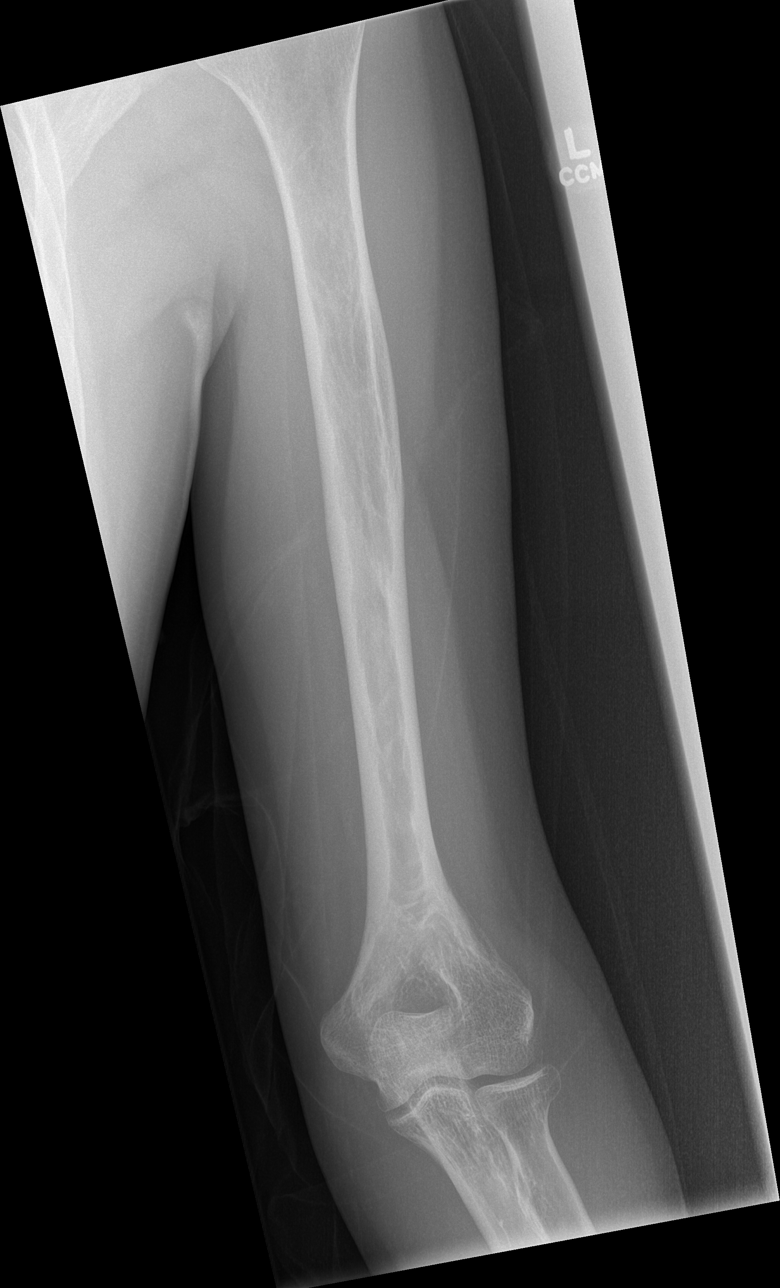

[w humerus lat left *]
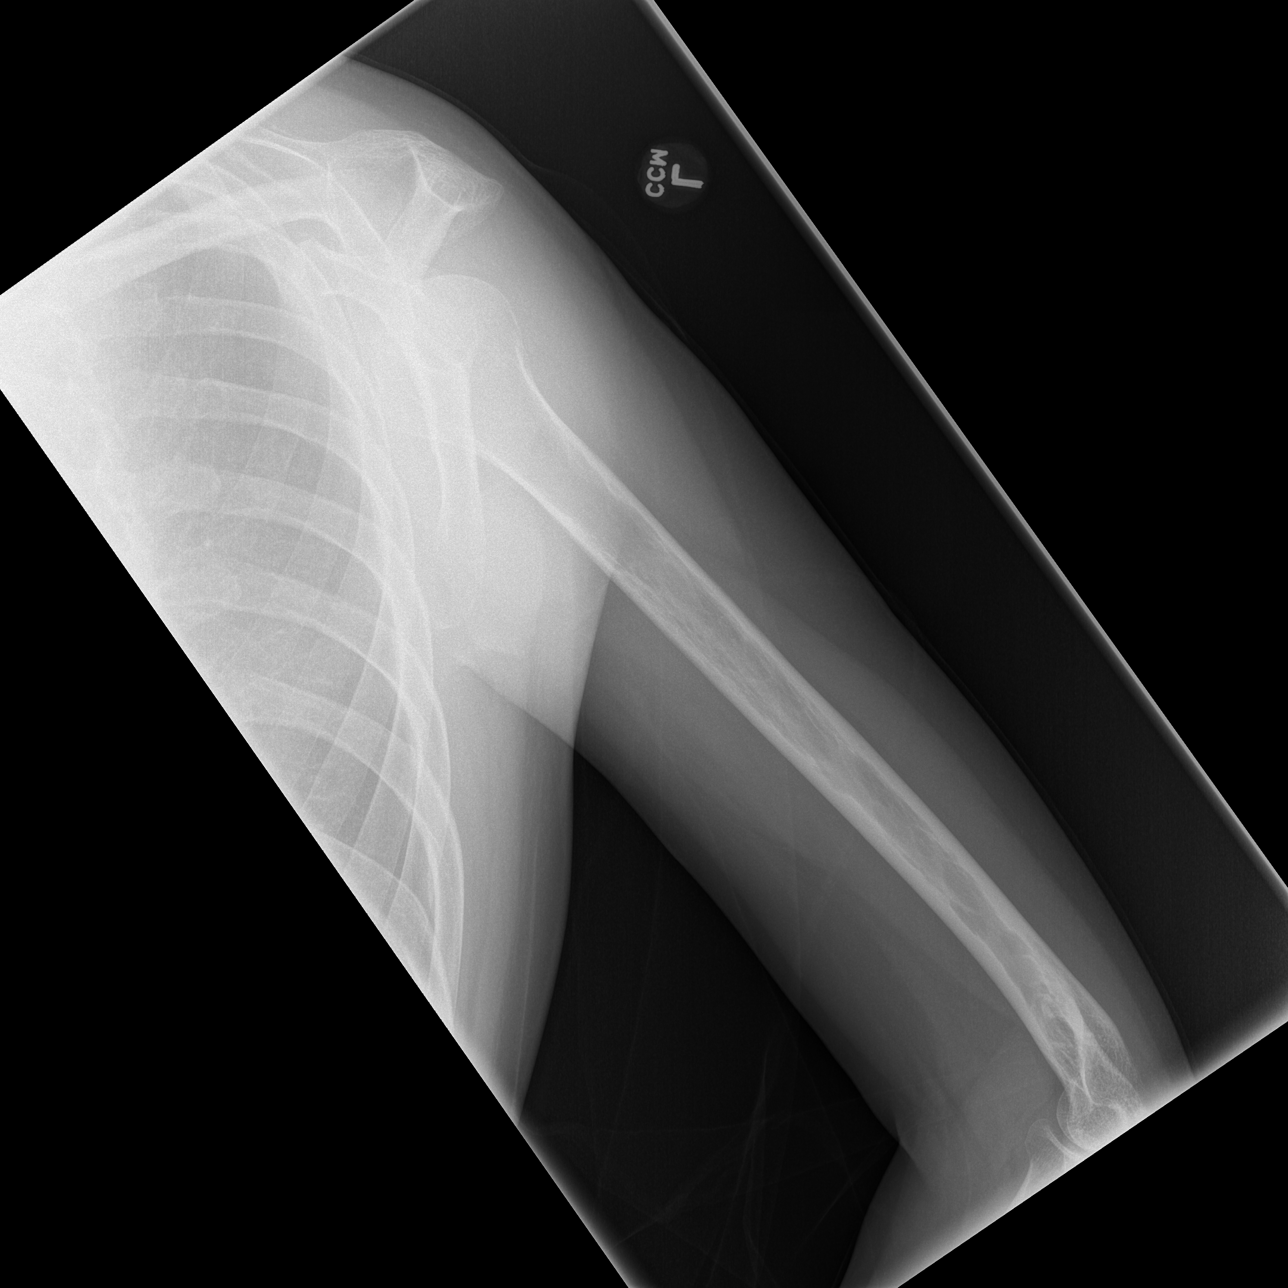

[w humerus ap left * (3 of 3)]
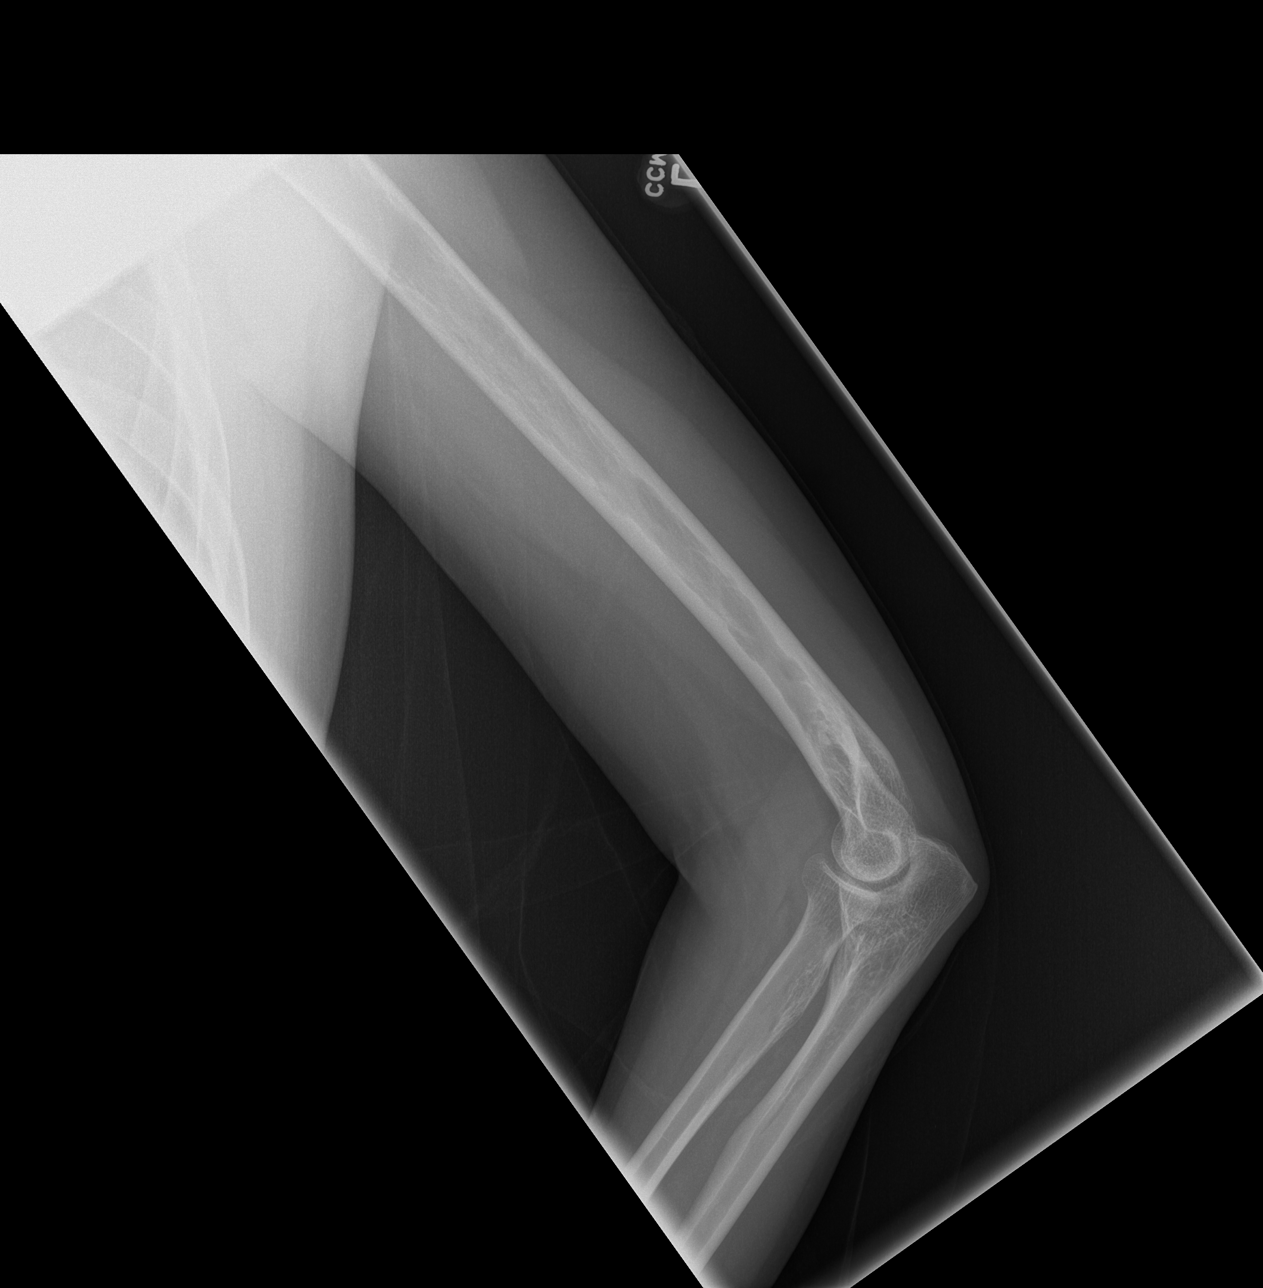

[4 of 4 positions shown; findings below may reference images not displayed]

FINDINGS: There is no evidence of fracture or other focal bone lesions. Soft
tissues are unremarkable.
IMPRESSION: No left humerus fracture

## 2018-07-21 IMAGING — CR DG FOREARM 2V*L*
3 series · 3 of 3 positions shown · non-contrast
Comparison: None.

CLINICAL DATA: Left upper extremity pain after recent fall.

EXAM:
LEFT FOREARM - 2 VIEW

[x forearm ap left]
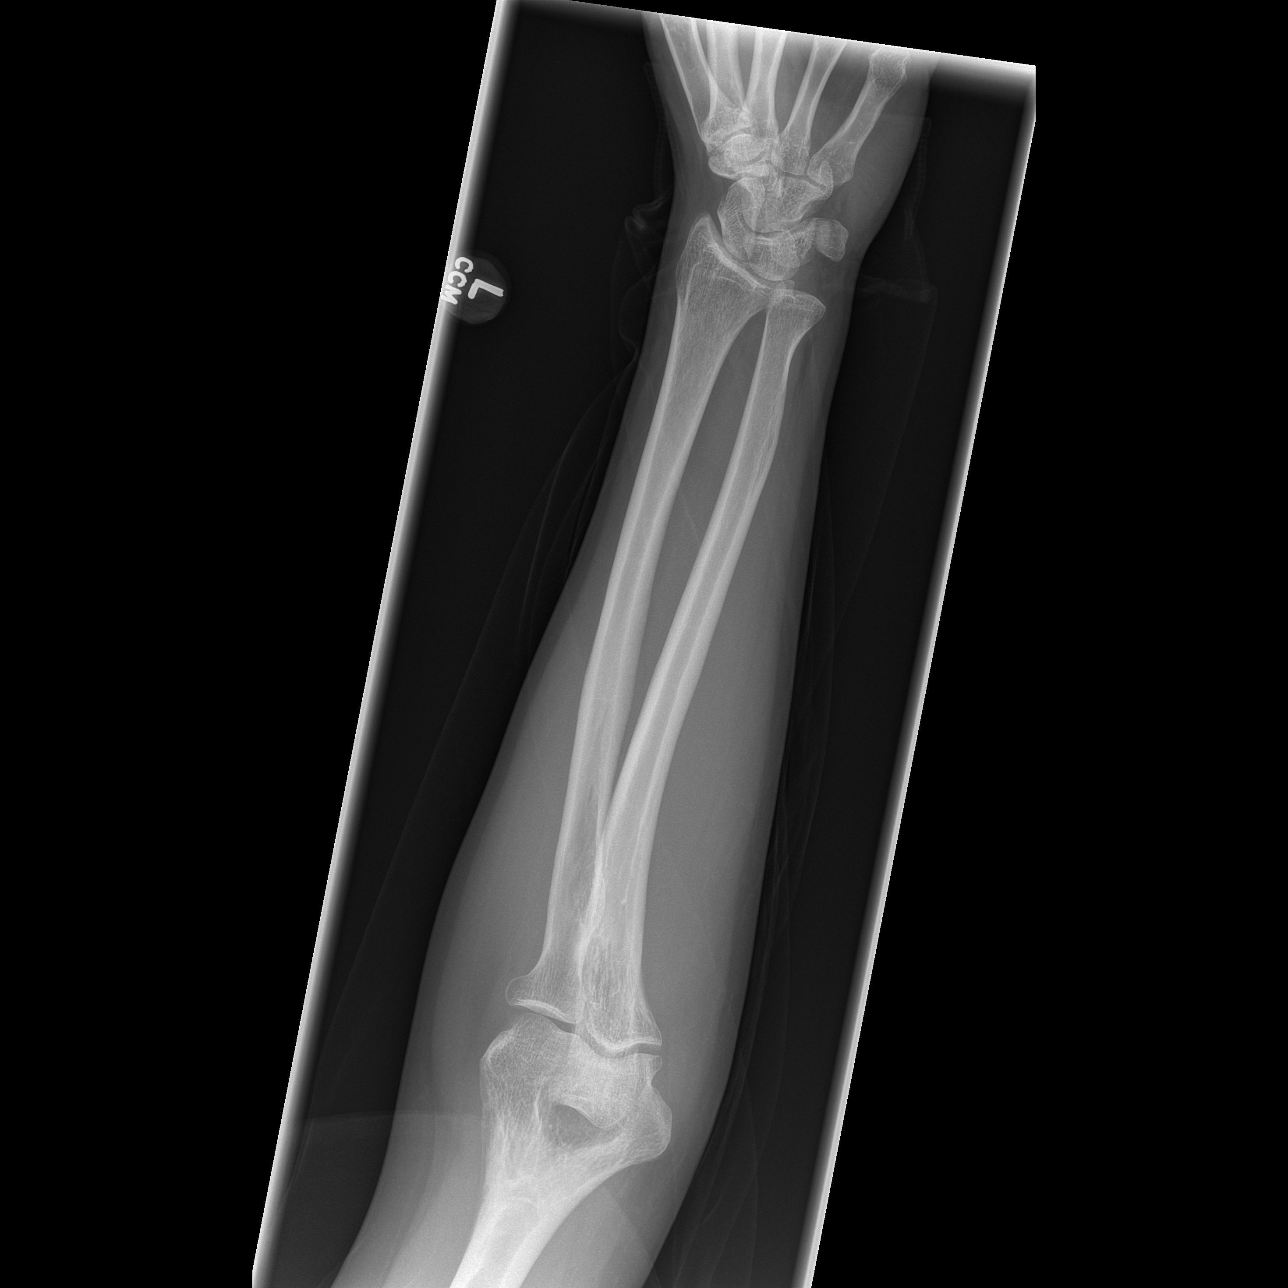

[x forearm lat left (1 of 2)]
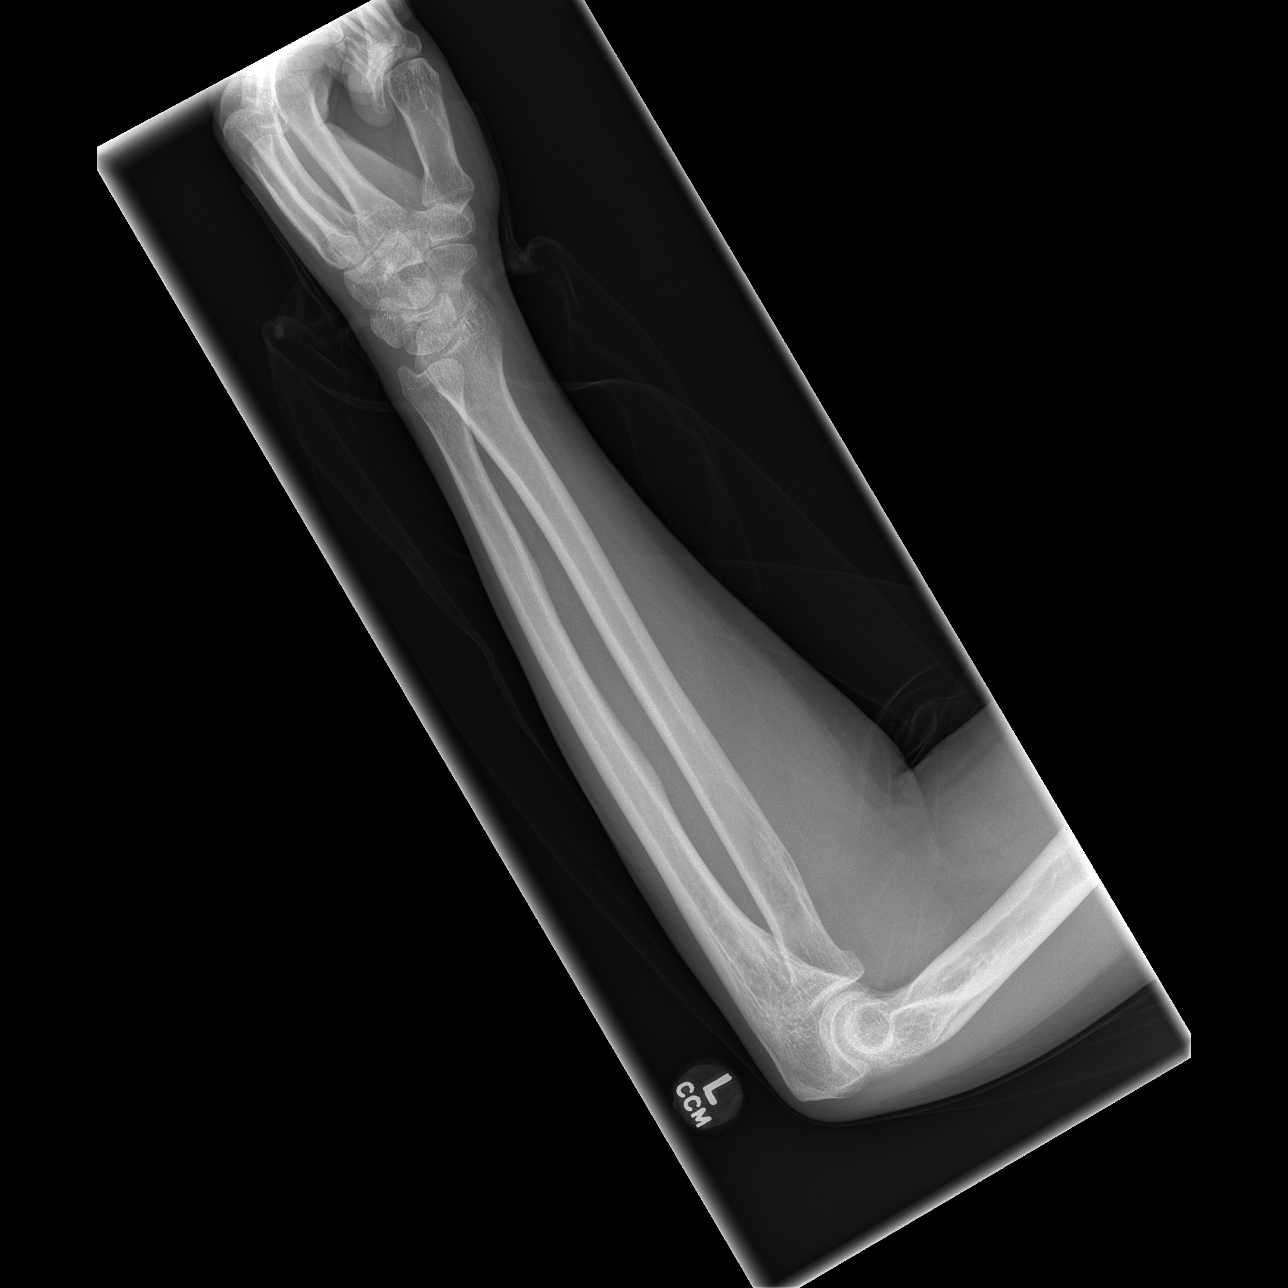

[x forearm lat left (2 of 2)]
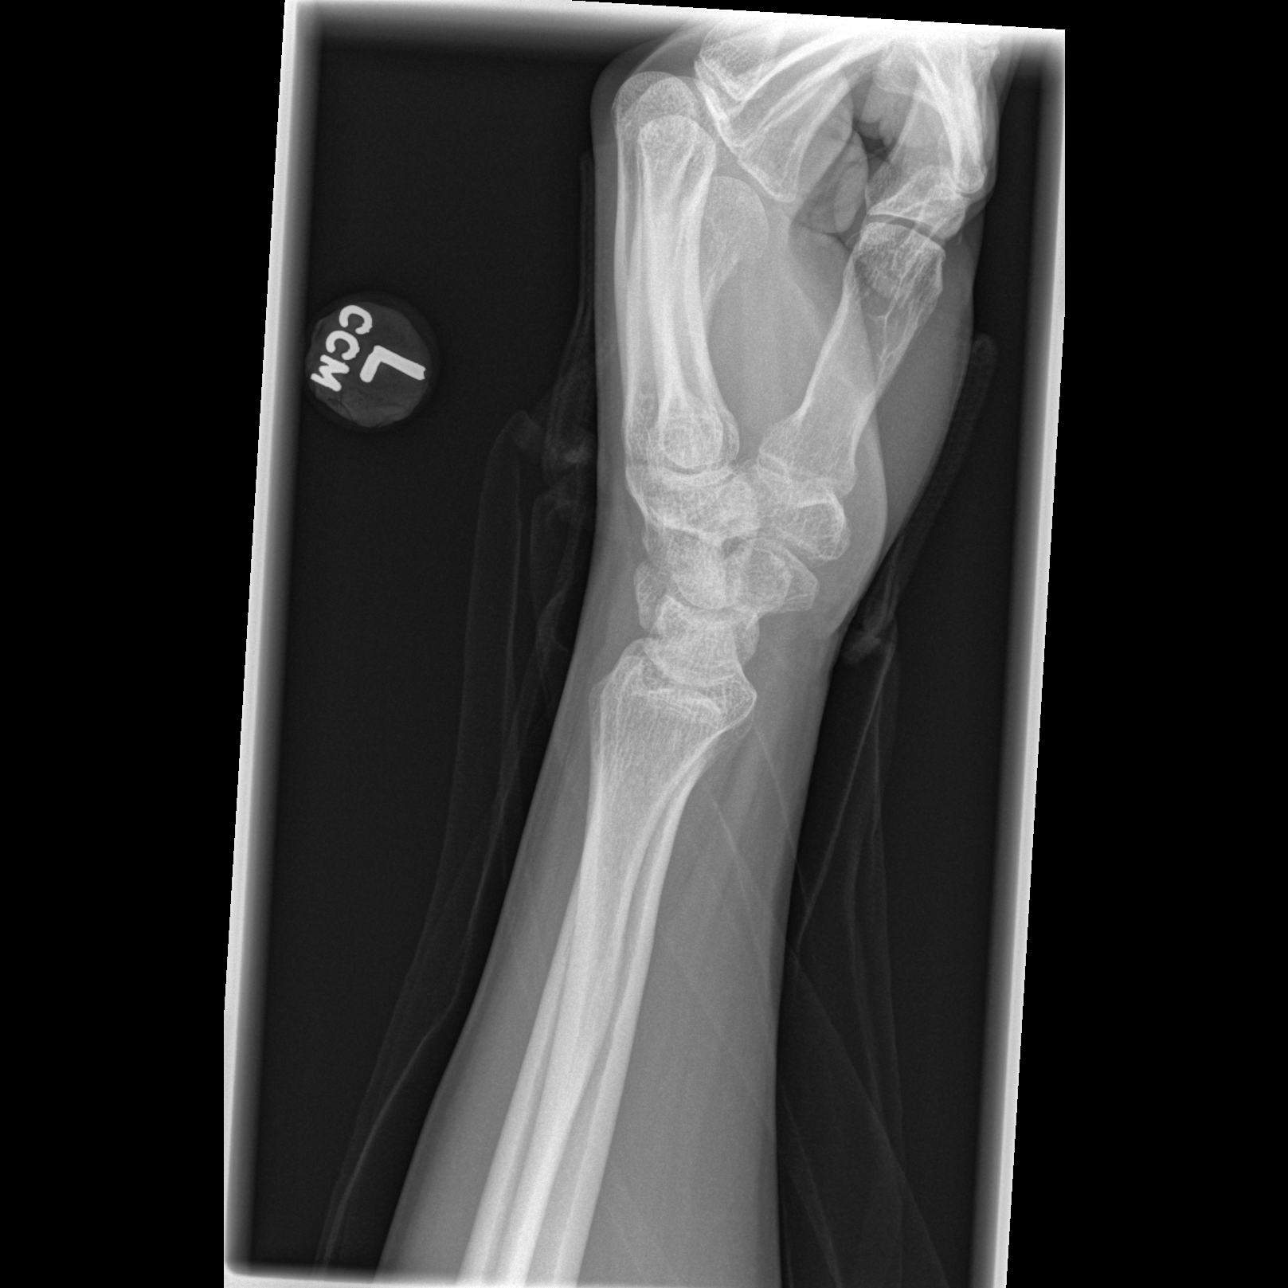

[3 of 3 positions shown; findings below may reference images not displayed]

FINDINGS: There is no evidence of fracture or other focal bone lesions. Soft
tissues are unremarkable.
IMPRESSION: No fracture.

## 2018-08-10 ENCOUNTER — Ambulatory Visit: Payer: Medicaid Other | Admitting: Hematology & Oncology

## 2018-08-10 ENCOUNTER — Other Ambulatory Visit: Payer: Medicaid Other

## 2018-08-10 ENCOUNTER — Ambulatory Visit: Payer: Medicaid Other
